# Patient Record
Sex: Male | Born: 1965
Health system: Southern US, Community
[De-identification: ages and names within clinical notes are randomized; demographics above are authoritative.]

## PROBLEM LIST (undated history)

## (undated) DIAGNOSIS — F329 Major depressive disorder, single episode, unspecified: Secondary | ICD-10-CM

## (undated) DIAGNOSIS — R109 Unspecified abdominal pain: Secondary | ICD-10-CM

## (undated) DIAGNOSIS — K2270 Barrett's esophagus without dysplasia: Secondary | ICD-10-CM

## (undated) DIAGNOSIS — R0600 Dyspnea, unspecified: Secondary | ICD-10-CM

## (undated) DIAGNOSIS — T7840XA Allergy, unspecified, initial encounter: Secondary | ICD-10-CM

## (undated) DIAGNOSIS — E1165 Type 2 diabetes mellitus with hyperglycemia: Secondary | ICD-10-CM

## (undated) DIAGNOSIS — U071 COVID-19: Secondary | ICD-10-CM

## (undated) DIAGNOSIS — G4733 Obstructive sleep apnea (adult) (pediatric): Secondary | ICD-10-CM

## (undated) DIAGNOSIS — L409 Psoriasis, unspecified: Secondary | ICD-10-CM

## (undated) DIAGNOSIS — G4700 Insomnia, unspecified: Secondary | ICD-10-CM

## (undated) DIAGNOSIS — R42 Dizziness and giddiness: Secondary | ICD-10-CM

## (undated) DIAGNOSIS — R5383 Other fatigue: Secondary | ICD-10-CM

## (undated) DIAGNOSIS — K5792 Diverticulitis of intestine, part unspecified, without perforation or abscess without bleeding: Secondary | ICD-10-CM

## (undated) DIAGNOSIS — I10 Essential (primary) hypertension: Secondary | ICD-10-CM

## (undated) DIAGNOSIS — M791 Myalgia, unspecified site: Secondary | ICD-10-CM

## (undated) DIAGNOSIS — J45909 Unspecified asthma, uncomplicated: Secondary | ICD-10-CM

## (undated) DIAGNOSIS — Z8489 Family history of other specified conditions: Secondary | ICD-10-CM

## (undated) DIAGNOSIS — N529 Male erectile dysfunction, unspecified: Secondary | ICD-10-CM

## (undated) DIAGNOSIS — G4730 Sleep apnea, unspecified: Secondary | ICD-10-CM

## (undated) DIAGNOSIS — F32A Depression, unspecified: Secondary | ICD-10-CM

## (undated) DIAGNOSIS — F419 Anxiety disorder, unspecified: Secondary | ICD-10-CM

## (undated) DIAGNOSIS — M419 Scoliosis, unspecified: Secondary | ICD-10-CM

## (undated) DIAGNOSIS — E785 Hyperlipidemia, unspecified: Secondary | ICD-10-CM

## (undated) DIAGNOSIS — E119 Type 2 diabetes mellitus without complications: Secondary | ICD-10-CM

## (undated) DIAGNOSIS — G2581 Restless legs syndrome: Secondary | ICD-10-CM

## (undated) DIAGNOSIS — K5900 Constipation, unspecified: Secondary | ICD-10-CM

## (undated) DIAGNOSIS — K219 Gastro-esophageal reflux disease without esophagitis: Secondary | ICD-10-CM

## (undated) DIAGNOSIS — Z9989 Dependence on other enabling machines and devices: Secondary | ICD-10-CM

## (undated) HISTORY — DX: Insomnia, unspecified: G47.00

## (undated) HISTORY — PX: REFRACTIVE SURGERY: SHX103

## (undated) HISTORY — DX: Diverticulitis of intestine, part unspecified, without perforation or abscess without bleeding: K57.92

## (undated) HISTORY — DX: COVID-19: U07.1

## (undated) HISTORY — DX: Scoliosis, unspecified: M41.9

## (undated) HISTORY — PX: NASAL SINUS SURGERY: SHX719

## (undated) HISTORY — DX: Psoriasis, unspecified: L40.9

## (undated) HISTORY — DX: Unspecified abdominal pain: R10.9

## (undated) HISTORY — DX: Other fatigue: R53.83

## (undated) HISTORY — DX: Hyperlipidemia, unspecified: E78.5

## (undated) HISTORY — DX: Sleep apnea, unspecified: G47.30

## (undated) HISTORY — DX: Gastro-esophageal reflux disease without esophagitis: K21.9

## (undated) HISTORY — DX: Myalgia, unspecified site: M79.10

## (undated) HISTORY — DX: Male erectile dysfunction, unspecified: N52.9

## (undated) HISTORY — PX: UPPER GASTROINTESTINAL ENDOSCOPY: SHX188

## (undated) HISTORY — DX: Dependence on other enabling machines and devices: Z99.89

## (undated) HISTORY — DX: Dyspnea, unspecified: R06.00

## (undated) HISTORY — DX: Constipation, unspecified: K59.00

## (undated) HISTORY — PX: UVULECTOMY: SHX2631

## (undated) HISTORY — DX: Anxiety disorder, unspecified: F41.9

## (undated) HISTORY — DX: Essential (primary) hypertension: I10

## (undated) HISTORY — DX: Barrett's esophagus without dysplasia: K22.70

## (undated) HISTORY — DX: Type 2 diabetes mellitus with hyperglycemia: E11.65

## (undated) HISTORY — DX: Dizziness and giddiness: R42

## (undated) HISTORY — DX: Type 2 diabetes mellitus without complications: E11.9

## (undated) HISTORY — DX: Depression, unspecified: F32.A

## (undated) HISTORY — DX: Unspecified asthma, uncomplicated: J45.909

## (undated) HISTORY — DX: Obstructive sleep apnea (adult) (pediatric): G47.33

## (undated) HISTORY — DX: Allergy, unspecified, initial encounter: T78.40XA

## (undated) HISTORY — DX: Restless legs syndrome: G25.81

## (undated) HISTORY — DX: Major depressive disorder, single episode, unspecified: F32.9

---

## 2007-06-16 ENCOUNTER — Ambulatory Visit: Payer: Self-pay | Admitting: Internal Medicine

## 2007-06-16 ENCOUNTER — Ambulatory Visit: Payer: Self-pay | Admitting: *Deleted

## 2007-06-16 ENCOUNTER — Inpatient Hospital Stay (HOSPITAL_COMMUNITY): Admission: EM | Admit: 2007-06-16 | Discharge: 2007-06-17 | Payer: Self-pay | Admitting: Emergency Medicine

## 2007-07-04 ENCOUNTER — Ambulatory Visit: Payer: Self-pay

## 2007-07-05 ENCOUNTER — Ambulatory Visit: Payer: Self-pay | Admitting: Cardiology

## 2007-11-28 ENCOUNTER — Ambulatory Visit: Payer: Self-pay | Admitting: Gastroenterology

## 2007-12-03 DIAGNOSIS — K219 Gastro-esophageal reflux disease without esophagitis: Secondary | ICD-10-CM

## 2007-12-03 HISTORY — DX: Gastro-esophageal reflux disease without esophagitis: K21.9

## 2007-12-08 ENCOUNTER — Ambulatory Visit: Payer: Self-pay | Admitting: Gastroenterology

## 2007-12-08 ENCOUNTER — Encounter: Payer: Self-pay | Admitting: Gastroenterology

## 2011-02-16 NOTE — Assessment & Plan Note (Signed)
[facility]                            CARDIOLOGY OFFICE NOTE   NAME:Mathis, Andrew                        MRN:          [id number]  DATE:07/05/2007                            DOB:          12-15-1965    The patient is a 45 year old gentleman who recently was admitted to  [facility] with atypical chest pain. He did rule out for  myocardial infarction with serial enzymes. His D-dimer was less than  0.22. He was discharged and had an echocardiogram performed on July 04, 2007 and interpreted by Dr. Diona Browner. His LV function was normal.  There was trivial mitral regurgitation as well as tricuspid  regurgitation. A stress echocardiogram was also performed. The patient  exercised for 10 minutes. His systolic blood pressure response was  normal and there was no chest pain or electrocardiographic changes.  There were no stress induced wall motion abnormalities. Since that time  he has not had chest pain, or shortness of breath, and there has been no  pedal edema, or syncope.   MEDICATIONS:  Include:  1. Prilosec 20 mg p.o. daily.  2. Lopressor 12.5 mg p.o. b.i.d.  3. Aspirin 325 mg p.o. daily.  4. Niacin 1 gram p.o. daily.  5. Zocor 20 mg p.o. daily.  6. Enalapril 2.5 mg p.o. daily.   PHYSICAL EXAMINATION:  Shows a blood pressure of 126/86 and his pulse is  73. He weighs 204 pounds.  HEENT: Normal.  NECK: Supple.  CHEST: Clear.  CARDIOVASCULAR EXAM: Reveals a regular rate and rhythm.  ABDOMINAL EXAM: Benign.  EXTREMITIES: Show no edema.   DIAGNOSES:  1. Recent atypical chest pain- his cardiac workup is benign and we      will therefore not pursue any further ischemia evaluation.  2. Hypertension- his blood pressure is well controlled at present. His      enalapril was initiated in the hospital and he apparently had a      BMET drawn at urgent care to follow up on his potassium and renal      function. I have asked him to follow up  with them concerning this      issue. Note if his blood pressure increases in the future then his      enalapril or his Lopressor could certainly be increased but I again      will leave this to his urgent care doctors.  3. Hyperlipidemia- he had niacin and Zocor started in the hospital. He      will need follow up cholesterol and liver function 6 weeks after      initiating this and I have asked him to follow up with the urgent      care doctors concerning this issue as well.   He will continue with risk factor modification and we will see him back  on an as needed basis.     Madolyn Frieze Jens Som, MD, [facility]  Electronically Signed    BSC/MedQ  DD: 07/05/2007  DT: 07/05/2007  Job #: [id number]   cc:   [facility]

## 2011-02-16 NOTE — H&P (Signed)
NAMECHRSTOPHER, Mathis               ACCOUNT NO.:  [id number]   MEDICAL RECORD NO.:  [id number]          PATIENT TYPE:  INP   LOCATION:  4707                         FACILITY:  [facility]   PHYSICIAN:  Wilhemina Bonito, M.D.     DATE OF BIRTH:  1966-07-13   DATE OF ADMISSION:  06/16/2007  DATE OF DISCHARGE:                              HISTORY & PHYSICAL   CHIEF COMPLAINT:  Chest pain.   HISTORY OF PRESENT ILLNESS:  [name] is a 45 year old man with  hypertension, hyperlipidemia, and a strong family history of coronary  artery disease and MI, who presents with a several-hour history of  burning chest pain.  He states that earlier today, he began noticing  right-sided burning and deep chest pain.  He had recent recovered  from a URI in the last two or three days and initially thought that it  may have been related.  At 3:00 p.m. as he was sitting at his desk at  work, the pain got acutely worse and spread diffusely through his entire  chest and back.  He describes the increased pain as a spasm.  The pain  gradually reduced.  He had some nausea associated with the pain, but  denied vomiting, dizziness, numbness, weakness, paresthesias and  shortness of breath.  He has had no cough recently.  He does endorse  occasional bitter taste in his mouth.  Pain is improved by lying still  and nitroglycerin did not affect it.   ALLERGIES:  AMPICILLIN, IODINE, THIMEROSAL.   MEDICATIONS:  1. Prilosec 20 mg daily.  2. Aspirin 81 mg daily.  3. Fish oil.  4. Multivitamin.  5. Garlic supplement.  6. Lysine supplement.  7. St. John's Wort.   PAST MEDICAL HISTORY:  1. Hypertension.  2. Hyperlipidemia.  3. GERD.   FAMILY HISTORY:  He has a family history of multiple coronary artery  disease and MIs in his father and uncles.  He had one uncle who died in  his 54's of myocardial infarction.  He also has a sister with coronary  artery disease.  Father also has a history of diabetes and colon polyps.  There is no family history of stroke or TIA.   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  He is divorced  and has two daughters ages 87 and 18.  He is a former smoker with  multiple quit attempts; he quit the most recent time two-and-a-half  years ago; previously he had been smoking approximately one pack a day.  He also started exercising about a month ago and he states that he can  run five miles without shortness of breath.   REVIEW OF SYSTEMS:  GENERAL:  No fever, chills or dizziness.  CARDIOVASCULAR:  As per HPI.  PULMONARY:  No shortness of breath, no  cough.  GI:  Nausea as per HPI, denies constipation, diarrhea, abdominal  pain, hematochezia and melena.  EXTREMITIES:  Denies swelling or pain.   VITALS:  Temperature 90.8, pulse 79, respirations 16, blood pressure  130/83 and O2 sat is 100% on two liters nasocannula  PHYSICAL EXAMINATION:  GENERAL:  He is alert, no apparent distress.  HEENT:  Extraocular motions intact.  Normocephalic, atraumatic.  Oropharynx clear.  NECK:  No palpable lymphadenopathy.  No thyromegaly and no JVD.  CARDIOVASCULAR:  Regular rate and rhythm without murmurs, rubs or  gallops.  PULMONARY:  Clear to auscultation bilaterally with good air movement and  no increased work of breathing.  ABDOMEN:  Soft, nontender, nondistended with positive bowel sounds.  EXTREMITIES:  No pretibial edema, no tenderness and 2+ tibial and  dorsalis pedis pulses.  NEURO:  Cranial nerves II-XII are grossly intact.  Patient was moving  all extremities and had normal finger-to-nose.   LABS:  CBC:  White blood cells 7.7, hemoglobin 14.8, hematocrit 43.9 and  platelets 279.  An i-STAT basic metabolic panel:  Sodium 138, potassium  3.8, chloride 104, bicarb 28.7, BUN 13, creatinine 0.9 and glucose 99.  D-dimer less than 0.22 and cardiac enzymes x1, myoglobin 48.5, CK-MB  less than 1, troponin less than 0.05.   EKG shows tachycardia with right bundle-branch  block that was seen in a  January EKG.   Chest x-ray done at [facility] and read by [facility]  radiologist as within normal limits.   ASSESSMENT/PLAN:  Mr. Boehringer is a 45 year old man with multiple cardiac  risk factors including hypertension, hyperlipidemia and a significant  family history, who presents with new-onset chest pain.   1. Chest pain.  Patient's description seems most consistent with      gastroesophageal reflux disease-type pain, but given his multiple      risk factors, it is important to rule out acute coronary syndrome.      Cardiac enzymes are negative x1.  Pneumonia or bronchitis is also a      possibility, but unlikely given normal chest x-ray, white blood      cell count and temperature. 1) Repeat cardiac enzymes at 4:00 a.m.,      2)  Repeat EKG in the morning, 3) Labs to include TSH, fasting      lipid profile, hemoglobin A1c, 4) Begin metoprolol at 12.5 mg,      enalapril at 2.5 mg, aspirin 325 mg, nitroglycerin for p.r.n. pain      and continue O2 and morphine for p.r.n. pain, 5) [facility]      to consult.  2. Hyperlipidemia.  Check fasting lipid profile, no statin currently.  3. Gastroesophageal reflux disease.  Start Protonix 40 mg IV daily.  4. FEN/GI:  Regular diet.  5. DVT prophylaxis:  Start Lovenox at prophylactic dose, no      anticoagulation since pain is atypical.      Wilhemina Bonito, M.D.  Electronically Signed     Wilhemina Bonito, M.D.  Electronically Signed    ML/MEDQ  D:  06/16/2007  T:  06/17/2007  Job:  [id number]

## 2011-02-16 NOTE — Assessment & Plan Note (Signed)
[facility]                         GASTROENTEROLOGY OFFICE NOTE   NAME:[name], [name]                        MRN:          [id number]  DATE:11/28/2007                            DOB:          January 09, 1966    PHYSICIAN REQUESTING CONSULT:  Shade Flood, M.D.   REASON FOR CONSULTATION:  Sore throat and chronic GERD.   HISTORY OF PRESENT ILLNESS:  [name] is a 45 year old white male who  relates a 10 year history of reflux symptoms which have been well  controlled on daily Prilosec.  For about the past two months, he has  noticed a sore throat with occasional tightness in his throat.  He has  also had left-sided neck pain intermittently.  He relates no difficulty  swallowing food or pills, but occasionally he has noted some difficulty  swallowing saliva.  He was evaluated at [facility]  by Drs. Janace Hoard and Meredith Staggers.  Blood work from October 19, 2007 including a CBC, comprehensive metabolic panel, lipid panel, TSH  and PSA were unremarkable except for a mildly elevated potassium at 5.3.  A cervical spine series showed slight loss of normal lordosis.  It was  otherwise negative.  He relates that while eating chocolate, he  developed spasms in his throat and neck and he has noted mild,  intermittent vocal fatigue and occasional hoarseness.  He has no solid  food dysphagia and he denies any substernal burning, regurgitation,  nocturnal symptoms, weight loss, melena, hematochezia or change in bowel  habits.   FAMILY HISTORY:  Remarkable for an uncle with colon cancer and a father  with colon polyp.  No other family members with colon cancer, colon  polyps or inflammatory bowel disease.   PAST MEDICAL HISTORY:  1. Hypertension.  2. Hyperlipidemia.  3. GERD.  4. Depression.  5. Anxiety.  6. History of sinusitis.   PAST SURGICAL HISTORY:  Status post uvulectomy and sinus surgery.   CURRENT MEDICATIONS:  Listed on  the chart, undated and reviewed.   MEDICATION ALLERGIES:  AMPICILLIN, IODINE   SOCIAL HISTORY/REVIEW OF SYSTEMS:  Per the handwritten form.   PHYSICAL EXAMINATION:  Well developed, mildly anxious, overweight, white  male.  Height 5 feet 9 inches.  Weight 205.6 pounds.  Blood pressure  120/76, pulse 88 and regular.  HEENT:  Anicteric sclerae.  Oropharynx clear status post uvulectomy.  NECK:  Without thyromegaly, adenopathy or tenderness appreciated.  CHEST:  Clear to auscultation bilaterally.  CARDIAC:  Regular rate and rhythm without murmurs appreciated.  ABDOMEN:  Soft, nontender, nondistended.  Normoactive bowel sounds.  No  palpable organomegaly, masses or hernias.  EXTREMITIES:  Without clubbing, cyanosis or edema.  NEUROLOGIC:  Alert and oriented x3.  Grossly nonfocal.   ASSESSMENT/PLAN:  Chronic gastroesophageal reflux disease with a  recurrent sore throat, throat tightness, left neck pain and intermittent  hoarseness.  He likely has underlying gastroesophageal reflux disease,  but it is difficult to attribute all of his symptoms to gastroesophageal  reflux disease.  We will proceed with upper endoscopy with possible  biopsy for further  evaluation.  Risks, benefits and alternatives to  upper endoscopy with possible biopsy discussed with the patient and he  consents to proceed.  This is scheduled electively.  He is to maintain  all standard anti-reflux measures and Prilosec 20 mg p.o. q.a.m.  I will  plan to  increase his PPI following the EGD.  I have advised him to  contact his ENT physician for additional evaluation.     Venita Lick. Russella Dar, MD, [facility]  Electronically Signed    MTS/MedQ  DD: 11/29/2007  DT: 11/29/2007  Job #: [id number]   cc:   Shade Flood, M.D.

## 2011-02-16 NOTE — Consult Note (Signed)
[name], KATICH NO.:  [id number]   MEDICAL RECORD NO.:  [id number]          PATIENT TYPE:  INP   LOCATION:  1830                         FACILITY:  [facility]   PHYSICIAN:  Adela Ports, MD   DATE OF BIRTH:  1966-09-16   DATE OF CONSULTATION:  06/16/2007  DATE OF DISCHARGE:                                 CONSULTATION   CHIEF COMPLAINT:  Chest pain.   HISTORY OF PRESENT ILLNESS:  [name] is a 45 year old gentleman with  no prior cardiac history but multiple cardiac risk factors including  history of tobacco use, family history of early cardiac disease,  hypertension, and hyperlipidemia.  He presented to the emergency  department today from urgent care after experiencing a sudden onset of  initially right and then bilateral chest burning and pain while sitting  at work this afternoon.  He reports that the onset was fairly sudden and  the pain was 8/10 and stopped him from talking.  He reports mild  shortness of breath and a feeling of flushing but denies nausea,  vomiting or radiation.  He went to an urgent care center and EKG and  chest x-ray there were unrevealing.  He was then moved to the emergency  department here.  His pain at this time has resolved almost completely,  though he still had the residual burning feeling that is exacerbated  with deep breath, movement or palpation.   [name] does have a history of gastroesophageal reflux disease but  says that this feels different than his typical GERD symptoms. He also  reports that he has had a recent viral syndrome over the past few weeks.  He has also started exercising over the past little while and runs up to  5 miles every other day without chest pain or significant dyspnea.   PAST MEDICAL HISTORY:  Hypertension, hypercholesterolemia,  gastroesophageal reflux disease.  He is not on home medical therapy for  these conditions at this time.   SOCIAL HISTORY:  The patient lives in [place]  alone.  He has shared  custody of the children.  He has a history of 30-pack years of smoking  but quit 2 years ago.  He drinks very rarely and exercises by running up  to 5 miles every other day.  He denies drug use.  He works as a Production designer, theatre/television/film  at [employer].   FAMILY HISTORY:  Mother is living and was recently diagnosed with  hypertension, hypercholesteremia at an old age.  Father has a history of  myocardial infarction in his 19s with CABG subsequently.  He has an  uncle with history of coronary disease and there is also prostate  cancer, diabetes and alcoholism in his family.   ALLERGIES:  1. CONTRAST ALLERGY.  2. AMPICILLIN.  3. THIMEROSAL.   MEDICATIONS CURRENTLY IN THE HOSPITAL:  1. Metoprolol 12.5 mg b.i.d.  2. Aspirin 325 mg daily.  3. Protonix 40 mg IV daily.  4. Enalapril 2.5 mg daily.  5. Lovenox 40 mg subcutaneous daily.   MEDICATIONS AT HOME:  None.   REVIEW OF SYSTEMS:  Twelve-system  review of systems is negative in  detail except as described in HPI.   PHYSICAL EXAMINATION:  VITAL SIGNS:  Temperature 98.5, pulse 79,  respiratory rate 16, blood pressure 130/83, oxygen saturation 100% on  room air and on 2 liters nasal cannula.  GENERAL:  Well nourished, well developed, pleasant white gentleman in no  apparent distress, though he is mildly anxious.  HEENT:  Unremarkable.  NECK:  Supple without lymphadenopathy.  No jugular venous distention.  Carotid upstrokes are 2+ bilaterally without bruits.  LUNGS:  Clear to auscultation bilaterally.  CARDIOVASCULAR:  Regular rate and rhythm with normal S1 and S2, no  gallop, no murmur, no rubs appreciated.  Distal pulses 2+ bilaterally  without bruits.  ABDOMEN:  Soft, nontender, nondistended without rebound or guarding and  normal bowel sounds.  EXTREMITIES:  Without clubbing, cyanosis or edema.  MUSCULOSKELETAL:  There is mild anterior chest wall tenderness with  palpation.  NEUROLOGIC:  Exam is appropriate and otherwise  unremarkable.   Chest x-ray is pending, but by report, chest x-ray at urgent care showed  no acute process.   EKG shows a rate of 81 beats per minute with sinus rhythm.  There are no  acute ST or T-wave changes.  There are 0.5 mm PR depressions in a number  of leads and 0.5 mm PR elevation in AVR and V1, but overall, a  nonspecific pattern.  There is an RSR prime in V1 and V2 that appears to  be a normal variant.   LABORATORY:  CBC and basic metabolic panel were unremarkable.  Cardiac  enzymes are negative x1.  D-dimer is negative.   ASSESSMENT:  [name] is a 45 year old male with a number of cardiac  risk factors who presents with quite atypical chest pain.  Differential  diagnoses includes gastroesophageal reflux disease, pericarditis in the  setting of recent viral infection, musculoskeletal pain, acute coronary  syndrome, PE, and anxiety.  I suspect this is either gastroesophageal  reflux disease or pericarditis.   PLAN:  1. Continue to cycle cardiac enzymes and follow EKGs to rule out acute      coronary syndrome.  There is no indication for additional      anticoagulation or antiplatelet therapy at this point.  However, if      his enzymes do turn positive, I would recommend initiating heparin      at treatment dose and a 2B/3A inhibitor with plan for angiography      after the weekend.  Continue aspirin, beta blocker and p.r.n.      nitrates at this point.  2. Risk factor management would include checking a lipid panel and      reinitiating antilipid therapy and continuing antihypertensive      medications on discharge.  3. Further risk stratification as an outpatient would be reasonable      given the patient's cardiac risk factors as well as his anxiety      about his family history and his desire for further risk      stratification.  Please contact [facility] prior to his      discharge for arrangements to have outpatient followup with likely      stress  testing.  4. Would continue a PPI for history of GERD and possible      GERD/esophageal spasm.  5. If myocardial infarction is ruled out and symptoms persist and no      other cause is identified, it would be reasonable to consider  treatment with high dose NSAIDs for possible pericarditis.      Adela Ports, MD  Electronically Signed     DWM/MEDQ  D:  06/16/2007  T:  06/17/2007  Job:  [id number]

## 2011-02-19 NOTE — Discharge Summary (Signed)
NAMEDIGBY, GROENEVELD               ACCOUNT NO.:  [id number]   MEDICAL RECORD NO.:  [id number]          PATIENT TYPE:  INP   LOCATION:  4707                         FACILITY:  [facility]   PHYSICIAN:  Zenaida Deed. Mayford Knife, M.D.DATE OF BIRTH:  1965/12/15   DATE OF ADMISSION:  06/16/2007  DATE OF DISCHARGE:  06/17/2007                               DISCHARGE SUMMARY   PRIMARY CARE PHYSICIAN:  [facility]   DISCHARGE DIAGNOSES:  1. Atypical chest pain, likely gastrointestinal etiology.  2. Gastroesophageal reflux disease  3. Hypertension.  4. Hyperlipidemia.  5. Former smoker.   DISCHARGE MEDICATIONS:  1. Prilosec 20 mg p.o. daily.  2. Metoprolol 12.5 mg p.o. b.i.d.  3. Aspirin 325 mg p.o. daily.  4  Niacin 1 gram p.o. nightly.  5  Simvastatin 20 mg p.o. daily.  6  Enalapril 2.5 mg p.o. daily.  7  Guaifenesin 200 mg p.o. every 4 hours as needed for congestion.   CONSULTATIONS:  [facility] was consulted and arranged for  outpatient stress echocardiogram and followup with Dr. Jens Som.   PROCEDURES:  Chest x-ray on September 12 at [facility] showed no  acute cardiopulmonary disease.  1. EKG showed sinus tachycardia with old right bundle branch block, no      acute changes.   PERTINENT LABORATORY DATA:  Cardiac enzymes were negative x3.  The D-  dimer was negative at less than 0.22.  Electrolytes within normal  limits.  CBC was within normal limits.   BRIEF HOSPITAL COURSE:  This is a 45 year old white male with multiple  cardiac risk factors including hypertension, hyperlipidemia and  significant family history who presented with new onset atypical chest  pain.   #1.  CHEST PAIN:  The patient's description of pain seemed consistent  with a GI etiology.  However, given his multiple risk factors despite  his negative cardiac enzymes and EKG, it was important to rule out acute  coronary syndrome.  Cardiology was consulted to assist with risk  stratification an  outpatient followup.  The patient's chest x-ray was  negative for pulmonary processes.  The patient was started on aspirin as  well as a statin  and niacin for cholesterol to help with risk  stratification.  TSH was normal.  The patient was also initiated on beta  blocker and ACE inhibitor given hypertension.  [facility] agreed  the patient was not having acute ischemia; however, given the patient's  risk factors, agreed that an outpatient stress echocardiogram and  followup with Dr. Jens Som would be prudent, and this was arranged prior  to the patient's discharge.  [facility] will contact the patient  with these appointments shortly after discharge.   #2.  GASTROESOPHAGEAL REFLUX DISEASE:  The patient has extensive history  and has been on Prilosec for years and reports adequate control his  symptoms as long as he takes Prilosec.  Continue proton pump inhibitor  while  in the hospital.   #3.  HYPERTENSION:  The patient was initiated on Toprol and enalapril.  Will need followup BMET approximately 1 week after discharge for  monitoring  of electrolytes and creatinine.   #4.  HYPERLIPIDEMIA:  The patient was initiated on a statin  and  niacin therapy for both low HDL and high LDL.   DISCHARGE INSTRUCTIONS:  The patient is to increase activity as  tolerated.  He is to follow a low-sodium, heart-healthy diet.  He is to  avoid straining and stop any activity that causes chest pain, shortness  of breath, dizziness, sweating or excessive weakness.   FOLLOWUP APPOINTMENTS:  1. The patient is to follow up with [facility] shortly after      discharge.  He is to see Dr. Jens Som, and the [facility]      office is to contact the patient with an appointment for stress      echocardiogram and followup in the office.  2. The patient is to follow up with [facility] within the next      week for repeat BMET to assess potassium and creatinine after       starting enalapril.   PENDING RESULTS AND ISSUES TO BE FOLLOWED AT TIME OF DISCHARGE:  1. The patient will need LFTs rechecked and 3-6 months after      initiating statin  therapy.  2. The patient will need continued monitoring of electrolytes and      renal function after initiation of enalapril therapy.   The patient was discharged home in stable medical condition.      Drue Dun, M.D.  Electronically Signed      Zenaida Deed. Mayford Knife, M.D.  Electronically Signed    EE/MEDQ  D:  06/18/2007  T:  06/18/2007  Job:  [id number]

## 2011-07-16 LAB — CBC
HCT: 43.2
HCT: 43.9
Hemoglobin: 14.6
Hemoglobin: 14.8
MCHC: 33.8
MCHC: 33.8
MCV: 86.3
MCV: 86.5
Platelets: 270
Platelets: 279
RBC: 5.01
RBC: 5.07
RDW: 13.4
RDW: 13.6
WBC: 6.6
WBC: 7.7

## 2011-07-16 LAB — I-STAT 8, (EC8 V) (CONVERTED LAB)
Acid-Base Excess: 3 — ABNORMAL HIGH
Acid-base deficit: 3 — ABNORMAL HIGH
BUN: 12
BUN: 13
Bicarbonate: 21.3
Bicarbonate: 28.7 — ABNORMAL HIGH
Chloride: 104
Chloride: 109
Glucose, Bld: 91
Glucose, Bld: 99
HCT: 45
HCT: 47
Hemoglobin: 15.3
Hemoglobin: 16
Operator id: 272551
Operator id: 277751
Potassium: 3.6
Potassium: 3.8
Sodium: 137
Sodium: 138
TCO2: 22
TCO2: 30
pCO2, Ven: 34.6 — ABNORMAL LOW
pCO2, Ven: 46.9
pH, Ven: 7.394 — ABNORMAL HIGH
pH, Ven: 7.397 — ABNORMAL HIGH

## 2011-07-16 LAB — COMPREHENSIVE METABOLIC PANEL
ALT: 28
AST: 22
Albumin: 3.7
Alkaline Phosphatase: 97
BUN: 14
CO2: 29
Calcium: 8.9
Chloride: 103
Creatinine, Ser: 0.81
GFR calc Af Amer: >60
GFR calc non Af Amer: >60
Glucose, Bld: 107 — ABNORMAL HIGH
Potassium: 4
Sodium: 138
Total Bilirubin: 0.8
Total Protein: 6.4

## 2011-07-16 LAB — DIFFERENTIAL
Basophils Absolute: 0
Basophils Relative: 0
Eosinophils Absolute: 0
Eosinophils Relative: 1
Lymphocytes Relative: 26
Lymphs Abs: 2
Monocytes Absolute: 0.7
Monocytes Relative: 10
Neutro Abs: 4.9
Neutrophils Relative %: 63

## 2011-07-16 LAB — POCT I-STAT CREATININE
Creatinine, Ser: 0.8
Creatinine, Ser: 0.9
Operator id: 272551
Operator id: 277751

## 2011-07-16 LAB — HEMOGLOBIN A1C
Hgb A1c MFr Bld: 5.7
Mean Plasma Glucose: 126

## 2011-07-16 LAB — CK TOTAL AND CKMB (NOT AT ARMC)
CK, MB: 1.4
Relative Index: 0.8
Total CK: 167

## 2011-07-16 LAB — POCT CARDIAC MARKERS
CKMB, poc: <1 — ABNORMAL LOW
Myoglobin, poc: 48.5
Operator id: 272551
Troponin i, poc: <0.05

## 2011-07-16 LAB — D-DIMER, QUANTITATIVE: D-Dimer, Quant: <0.22

## 2011-07-16 LAB — CARDIAC PANEL(CRET KIN+CKTOT+MB+TROPI)
CK, MB: 1
CK, MB: 1
Relative Index: 0.7
Relative Index: 0.8
Total CK: 119
Total CK: 146
Troponin I: 0.01
Troponin I: 0.02

## 2011-07-16 LAB — PROTIME-INR
INR: 0.9
Prothrombin Time: 12.6

## 2011-07-16 LAB — LIPID PANEL
Cholesterol: 202 — ABNORMAL HIGH
HDL: 30 — ABNORMAL LOW
LDL Cholesterol: 138 — ABNORMAL HIGH
Total CHOL/HDL Ratio: 6.7
Triglycerides: 168 — ABNORMAL HIGH
VLDL: 34

## 2011-07-16 LAB — TROPONIN I: Troponin I: 0.01

## 2011-07-16 LAB — TSH: TSH: 1.511

## 2011-10-14 ENCOUNTER — Ambulatory Visit (INDEPENDENT_AMBULATORY_CARE_PROVIDER_SITE_OTHER): Payer: Commercial Managed Care - PPO

## 2011-10-14 DIAGNOSIS — H6120 Impacted cerumen, unspecified ear: Secondary | ICD-10-CM

## 2011-10-14 DIAGNOSIS — E789 Disorder of lipoprotein metabolism, unspecified: Secondary | ICD-10-CM

## 2011-10-14 DIAGNOSIS — Z79899 Other long term (current) drug therapy: Secondary | ICD-10-CM

## 2011-10-14 DIAGNOSIS — I10 Essential (primary) hypertension: Secondary | ICD-10-CM

## 2011-12-21 ENCOUNTER — Other Ambulatory Visit: Payer: Self-pay | Admitting: Family Medicine

## 2012-01-19 ENCOUNTER — Ambulatory Visit (INDEPENDENT_AMBULATORY_CARE_PROVIDER_SITE_OTHER): Payer: Commercial Managed Care - PPO | Admitting: Family Medicine

## 2012-01-19 VITALS — BP 118/82 | HR 76 | Temp 98.3°F | Resp 18 | Ht 69.0 in | Wt 202.0 lb

## 2012-01-19 DIAGNOSIS — E785 Hyperlipidemia, unspecified: Secondary | ICD-10-CM

## 2012-01-19 DIAGNOSIS — F411 Generalized anxiety disorder: Secondary | ICD-10-CM

## 2012-01-19 DIAGNOSIS — I10 Essential (primary) hypertension: Secondary | ICD-10-CM

## 2012-01-19 DIAGNOSIS — F419 Anxiety disorder, unspecified: Secondary | ICD-10-CM

## 2012-01-19 DIAGNOSIS — F449 Dissociative and conversion disorder, unspecified: Secondary | ICD-10-CM

## 2012-01-19 MED ORDER — LISINOPRIL 10 MG PO TABS: 10.0000 mg | ORAL_TABLET | Freq: Every day | ORAL | Status: DC

## 2012-01-19 MED ORDER — CLONAZEPAM 0.5 MG PO TABS: 0.5000 mg | ORAL_TABLET | Freq: Two times a day (BID) | ORAL | Status: DC | PRN

## 2012-01-19 NOTE — Patient Instructions (Signed)
Used the antianxiety medicine only when necessary. If you are having to use it too frequently we will give you a prescription for a longer acting agent.  Continue counseling, regular exercise, and get sufficient rest.  Return for recheck in about 2 months. There problems.

## 2012-01-19 NOTE — Progress Notes (Signed)
Subjective: 46 year old white male who is here for several things. Several flutter. He was undergoing major marital stress and a separation and divorce. He developed left arm numbness and some stuttering and slurred speech. He ended up having a visit to an internist in [facility] having been in the hospital in [facility], and then seeing a neurologist. He had extensive workup including MRI and EEG. Nothing was found except for feeling back this was I. anxiety related condition (consistent with a conversion reaction) and he is seeing a therapist, and doing well with that. He is attending church or when that [employer]. He has cut back his hours at work and reducing stress. He is out of his blood pressure medicine and needs that refilled. He knows he needs to somehow get a physical examination. Change in insurances, and will have a two-week gap he does not have any insurance no other physical symptoms. Denies suicidal ideology.  Objective: White male alert oriented no acute distress. Motor strength is symmetrical her speech is clear. Throat clear. Neck supple without nodes. Chest clear. Heart regular without murmurs. Eyes PERRLA. TMs normal. Gait normal.  Assessment: Anxiety Hypertension Hyperlipidemia Conversion reaction  Plan: Continue his therapy. Gave him clonazepam 0.0.5 milligrams to take twice a day on a prn basis. If he needs it too often we will give him an SSRI  He is scheduled to see me in a couple months. I'll do physical intake that'll skin lesion of his chest wall at that time. It appears to be a seborrheic keratosis which can be frozen off.

## 2012-06-23 ENCOUNTER — Other Ambulatory Visit: Payer: Self-pay | Admitting: Internal Medicine

## 2012-07-13 ENCOUNTER — Other Ambulatory Visit: Payer: Self-pay | Admitting: Internal Medicine

## 2012-08-11 ENCOUNTER — Ambulatory Visit (INDEPENDENT_AMBULATORY_CARE_PROVIDER_SITE_OTHER): Payer: BC Managed Care – PPO | Admitting: Family Medicine

## 2012-08-11 VITALS — BP 122/80 | HR 81 | Temp 98.1°F | Resp 16 | Ht 69.0 in | Wt 192.0 lb

## 2012-08-11 DIAGNOSIS — K2270 Barrett's esophagus without dysplasia: Secondary | ICD-10-CM

## 2012-08-11 DIAGNOSIS — N529 Male erectile dysfunction, unspecified: Secondary | ICD-10-CM

## 2012-08-11 DIAGNOSIS — E785 Hyperlipidemia, unspecified: Secondary | ICD-10-CM

## 2012-08-11 DIAGNOSIS — F411 Generalized anxiety disorder: Secondary | ICD-10-CM

## 2012-08-11 DIAGNOSIS — I10 Essential (primary) hypertension: Secondary | ICD-10-CM

## 2012-08-11 DIAGNOSIS — Z23 Encounter for immunization: Secondary | ICD-10-CM

## 2012-08-11 DIAGNOSIS — F419 Anxiety disorder, unspecified: Secondary | ICD-10-CM

## 2012-08-11 DIAGNOSIS — Z0000 Encounter for general adult medical examination without abnormal findings: Secondary | ICD-10-CM

## 2012-08-11 LAB — LIPID PANEL
Cholesterol: 161 mg/dL (ref 0–200)
HDL: 39 mg/dL — ABNORMAL LOW (ref >39)
LDL Cholesterol: 97 mg/dL (ref 0–99)
Total CHOL/HDL Ratio: 4.1 Ratio
Triglycerides: 127 mg/dL (ref <150)
VLDL: 25 mg/dL (ref 0–40)

## 2012-08-11 LAB — COMPREHENSIVE METABOLIC PANEL
ALT: 24 U/L (ref 0–53)
AST: 17 U/L (ref 0–37)
Albumin: 4.9 g/dL (ref 3.5–5.2)
Alkaline Phosphatase: 101 U/L (ref 39–117)
BUN: 14 mg/dL (ref 6–23)
CO2: 30 mEq/L (ref 19–32)
Calcium: 10 mg/dL (ref 8.4–10.5)
Chloride: 101 mEq/L (ref 96–112)
Creat: 0.81 mg/dL (ref 0.50–1.35)
Glucose, Bld: 89 mg/dL (ref 70–99)
Potassium: 4.7 mEq/L (ref 3.5–5.3)
Sodium: 137 mEq/L (ref 135–145)
Total Bilirubin: 0.7 mg/dL (ref 0.3–1.2)
Total Protein: 7.5 g/dL (ref 6.0–8.3)

## 2012-08-11 LAB — POCT CBC
Granulocyte percent: 67 %G (ref 37–80)
HCT, POC: 52 % (ref 43.5–53.7)
Hemoglobin: 16.5 g/dL (ref 14.1–18.1)
Lymph, poc: 1.7 (ref 0.6–3.4)
MCH, POC: 29.2 pg (ref 27–31.2)
MCHC: 31.7 g/dL — AB (ref 31.8–35.4)
MCV: 92.1 fL (ref 80–97)
MID (cbc): 0.4 (ref 0–0.9)
MPV: 9.1 fL (ref 0–99.8)
POC Granulocyte: 4.4 (ref 2–6.9)
POC LYMPH PERCENT: 26.2 %L (ref 10–50)
POC MID %: 6.8 %M (ref 0–12)
Platelet Count, POC: 308 10*3/uL (ref 142–424)
RBC: 5.65 M/uL (ref 4.69–6.13)
RDW, POC: 14.3 %
WBC: 6.5 10*3/uL (ref 4.6–10.2)

## 2012-08-11 LAB — TSH: TSH: 0.728 u[IU]/mL (ref 0.350–4.500)

## 2012-08-11 LAB — IFOBT (OCCULT BLOOD): IFOBT: NEGATIVE

## 2012-08-11 LAB — POCT GLYCOSYLATED HEMOGLOBIN (HGB A1C): Hemoglobin A1C: 5.4

## 2012-08-11 MED ORDER — LISINOPRIL 10 MG PO TABS: 10.0000 mg | ORAL_TABLET | Freq: Every day | ORAL | Status: DC

## 2012-08-11 MED ORDER — ALPRAZOLAM 0.25 MG PO TABS: 0.2500 mg | ORAL_TABLET | Freq: Two times a day (BID) | ORAL | Status: DC | PRN

## 2012-08-11 MED ORDER — BUPROPION HCL ER (XL) 300 MG PO TB24: 300.0000 mg | ORAL_TABLET | Freq: Every day | ORAL | Status: DC

## 2012-08-11 MED ORDER — SIMVASTATIN 20 MG PO TABS: 20.0000 mg | ORAL_TABLET | Freq: Every day | ORAL | Status: DC

## 2012-08-11 MED ORDER — OMEPRAZOLE 20 MG PO CPDR: 20.0000 mg | DELAYED_RELEASE_CAPSULE | Freq: Two times a day (BID) | ORAL | Status: DC

## 2012-08-11 MED ORDER — VARDENAFIL HCL 10 MG PO TABS: 10.0000 mg | ORAL_TABLET | Freq: Every day | ORAL | Status: DC | PRN

## 2012-08-11 NOTE — Patient Instructions (Signed)
Return in 6 months

## 2012-08-11 NOTE — Progress Notes (Signed)
History: Patient is here for his annual physical. He has several things on his mind to discuss, but no major complaints.  He has used the clonipen and but has felt like it cause excessive daytime drowsiness. He had bouts of Xanax and that seemed to work much better for him.  He has had some problems with erectile difficulty. This may take along with his stressful and hectic life pace at his workplace.  The major new issues.  Past medical history: See a stream chart  Social history: Married, 2 children who are joint custody. Works for [employer].  Review of systems: Constitutional unremarkable HEENT: Unremarkable Respiratory: Unremarkable Cardiovascular: Unremarkable GI: Unremarkable GU: Unremarkable The skeletal: Unremarkable Dermatologic: Has the lesion on his right upper chest wall which we take off a week or so Neurologic: Unremarkable Hematologic: Unremarkable Psychiatric: Has his near panic episodes as noted above Endocrinologic: Unremarkable Patient would like a referral for sleep study to reassess his BiPAP needs. Also would like a endoscopy for reassessing his history of Barrett's esophagus.  Physical examination pleasant alert male in no acute distress. HEENT: Eyes PERRLA. Fundi benign. TMs normal. Throat clear. Neck supple without nodes or thyromegaly. Chest is clear to auscultation. Heart regular without murmur scalp slight redness. And soft without mass or tenderness. Normal male external genitalia testes descended. No hernias. Digital rectal exam is normal. Skin unremarkable. Has a little slightly pigmented pain I tried chest wall. He has had his uvula removed.  Assessment: Normal physical examination Hyperlipidemia Hypertension, controlled Anxiety and panic disorder History of sleep apnea History of Barrett's esophagus.  ED Sebaceous cyst left scalp  Plan: Arrange for sleep study to followup Referral for followup to endoscopy for Barrett's esophagus Try Xanax Try  Levitra Continue medications

## 2012-08-12 LAB — PSA: PSA: 0.83 ng/mL (ref <=4.00)

## 2012-08-14 ENCOUNTER — Encounter: Payer: Self-pay | Admitting: Gastroenterology

## 2012-08-22 ENCOUNTER — Encounter: Payer: Self-pay | Admitting: Physician Assistant

## 2012-08-22 ENCOUNTER — Ambulatory Visit (INDEPENDENT_AMBULATORY_CARE_PROVIDER_SITE_OTHER): Payer: BC Managed Care – PPO | Admitting: Physician Assistant

## 2012-08-22 VITALS — BP 110/72 | HR 74 | Temp 98.4°F | Resp 16 | Ht 68.0 in | Wt 191.8 lb

## 2012-08-22 DIAGNOSIS — L820 Inflamed seborrheic keratosis: Secondary | ICD-10-CM

## 2012-08-22 DIAGNOSIS — F419 Anxiety disorder, unspecified: Secondary | ICD-10-CM | POA: Insufficient documentation

## 2012-08-22 DIAGNOSIS — G4733 Obstructive sleep apnea (adult) (pediatric): Secondary | ICD-10-CM | POA: Insufficient documentation

## 2012-08-22 DIAGNOSIS — K2270 Barrett's esophagus without dysplasia: Secondary | ICD-10-CM

## 2012-08-22 DIAGNOSIS — I10 Essential (primary) hypertension: Secondary | ICD-10-CM | POA: Insufficient documentation

## 2012-08-22 DIAGNOSIS — Z9989 Dependence on other enabling machines and devices: Secondary | ICD-10-CM | POA: Insufficient documentation

## 2012-08-22 NOTE — Progress Notes (Signed)
  Subjective:    Patient ID: [name], male    DOB: 04/16/1966, 46 y.o.   MRN: [id number]  HPI This 46 y.o. male presents for removal of a lesion on the right upper chest.  It was evaluated by Dr. Alwyn Ren at 2 previous visits, and the patient is ready to have it removed.  It has become larger in size, and now gets irritated and painful rubbing on his clothing.  No bleeding or drainage.  He has had 2 previous lesions removed by freezing with success. Review of Systems As above.  PMH, SurgHx, Soc Hx, FHx, current medications, allergies and problem list reviewed and updated.    Objective:   Physical Exam BP 110/72  Pulse 74  Temp 98.4 F (36.9 C) (Oral)  Resp 16  Ht 5\' 8"  (1.727 m)  Wt 191 lb 12.8 oz (87 kg)  BMI 29.16 kg/m2  SpO2 99% WDWNWM, A&O x 3. Right upper chest wall is noted for a gray-colored raised lesion measuring 3-4 mm.  Warty surface.  Non-tender today.  Non-erythematous base.  Verbal consent obtained.  Cleansed skin with alcohol prep pad.  Cryotherapy applied (freeze-thaw-freeze).  The patient tolerated the procedure well.     Assessment & Plan:   1. Keratosis, inflamed seborrheic    Local wound care.  RTC as needed.

## 2012-08-22 NOTE — Patient Instructions (Addendum)
Keep the wound area clean with soap and water daily.  Apply a dab of antibiotic ointment daily until the lesion heals.  Return for re-evaluation if you develop increased pain, swelling, redness, red streaking, drainage of pus, or run a fever >101

## 2012-09-06 ENCOUNTER — Encounter: Payer: Self-pay | Admitting: Gastroenterology

## 2012-09-06 ENCOUNTER — Ambulatory Visit (INDEPENDENT_AMBULATORY_CARE_PROVIDER_SITE_OTHER): Payer: BC Managed Care – PPO | Admitting: Gastroenterology

## 2012-09-06 VITALS — BP 98/70 | HR 80 | Ht 68.0 in | Wt 195.4 lb

## 2012-09-06 DIAGNOSIS — Z1211 Encounter for screening for malignant neoplasm of colon: Secondary | ICD-10-CM

## 2012-09-06 DIAGNOSIS — K2270 Barrett's esophagus without dysplasia: Secondary | ICD-10-CM

## 2012-09-06 NOTE — Progress Notes (Signed)
History of Present Illness: This is a 46 year old male with a history of Barrett's esophagus diagnosed in 2009. His reflux symptoms have been under excellent control on omeprazole 20 mg twice daily and standard antireflux measures. He has no gastrointestinal complaints. Denies weight loss, abdominal pain, constipation, diarrhea, change in stool caliber, melena, hematochezia, nausea, vomiting, dysphagia, reflux symptoms, chest pain.  Allergies  Allergen Reactions  . Thimerosal Swelling  . Ampicillin Rash    Childhood.  . Iodine Rash    Child hood.   Outpatient Prescriptions Prior to Visit  Medication Sig Dispense Refill  . ALPRAZolam (XANAX) 0.25 MG tablet Take 1 tablet (0.25 mg total) by mouth 2 (two) times daily as needed for sleep.  30 tablet  1  . buPROPion (WELLBUTRIN XL) 300 MG 24 hr tablet Take 1 tablet (300 mg total) by mouth daily. Needs office visit (second notice)  33 tablet  12  . lisinopril (PRINIVIL,ZESTRIL) 10 MG tablet Take 1 tablet (10 mg total) by mouth daily.  30 tablet  12  . niacin 500 MG tablet Take 500 mg by mouth daily with breakfast.      . omeprazole (PRILOSEC) 20 MG capsule Take 1 capsule (20 mg total) by mouth 2 (two) times daily.  60 capsule  12  . simvastatin (ZOCOR) 20 MG tablet Take 1 tablet (20 mg total) by mouth daily.  30 tablet  12  . vardenafil (LEVITRA) 10 MG tablet Take 1 tablet (10 mg total) by mouth daily as needed for erectile dysfunction.  3 tablet  5  . zolpidem (AMBIEN) 10 MG tablet Take 10 mg by mouth at bedtime as needed.      . [DISCONTINUED] clonazePAM (KLONOPIN) 0.5 MG tablet Take 1 tablet (0.5 mg total) by mouth 2 (two) times daily as needed for anxiety.  30 tablet  1   Last reviewed on 09/06/2012  9:47 AM by Meryl Dare, MD,FACG Past Medical History  Diagnosis Date  . Depression   . Arthritis   . Hypertension   . OSA on CPAP   . Anxiety   . GERD (gastroesophageal reflux disease) 12/2007  . Barrett's esophagus   . HLD  (hyperlipidemia)    Past Surgical History  Procedure Date  . Refractive surgery     bilateral  . Uvulectomy   . Nasal sinus surgery    History   Social History  . Marital Status: Married    Spouse Name: Gladis Riffle    Number of Children: 2  . Years of Education: 16   Occupational History  . TECHNICIAN MANAGER    Social History Main Topics  . Smoking status: Former Smoker    Types: Cigarettes    Quit date: 01/19/2007  . Smokeless tobacco: Never Used  . Alcohol Use: 0.0 - 2.5 oz/week    0-5 drink(s) per week     Comment: usually once a month  . Drug Use: No  . Sexually Active: Yes -- Male partner(s)   Other Topics Concern  . None   Social History Narrative   Lives with wife (travelling nurse, gone about half the time) from whom he is separated, but on good terms.  Shares custody of his daughters with his first wife.  His current wife has two sons, one grown, the other lives with his father.   Family History  Problem Relation Age of Onset  . Glaucoma Mother   . Cataracts Mother     complicated surgery  . Prostate cancer Father  metastatic  . Hypertension Father   . Alcohol abuse Father   . Heart disease Father   . Diabetes Father   . Anxiety disorder Sister   . Hypertension Sister   . Cirrhosis Father   . Glaucoma Paternal Uncle   . Colon cancer Paternal Uncle     Physical Exam: General: Well developed , well nourished, no acute distress Head: Normocephalic and atraumatic Eyes:  sclerae anicteric, EOMI Ears: Normal auditory acuity Mouth: No deformity or lesions Lungs: Clear throughout to auscultation Heart: Regular rate and rhythm; no murmurs, rubs or bruits Abdomen: Soft, non tender and non distended. No masses, hepatosplenomegaly or hernias noted. Normal Bowel sounds Rectal: Deferred with recent unremarkable exam by his PCP  Musculoskeletal: Symmetrical with no gross deformities  Pulses:  Normal pulses noted Extremities: No clubbing,  cyanosis, edema or deformities noted Neurological: Alert oriented x 4, grossly nonfocal Psychological:  Alert and cooperative. Normal mood and affect  Assessment and Recommendations:  1. Barrett's esophagus. Reflux symptoms under excellent control on omeprazole 20 mg twice a day and standard antireflux measures. Schedule surveillance endoscopy. The risks, benefits, and alternatives to endoscopy with possible biopsy and possible dilation were discussed with the patient and they consent to proceed.   2. Colorectal cancer screening, routine risk. Colonoscopy at age 48.

## 2012-09-06 NOTE — Patient Instructions (Addendum)
You have been scheduled for an endoscopy with propofol. Please follow written instructions given to you at your visit today. If you use inhalers (even only as needed) or a CPAP machine, please bring them with you on the day of your procedure.   You will be due for a recall colonoscopy in 04/2016. We will send you a reminder in the mail when it gets closer to that time.

## 2012-09-11 ENCOUNTER — Ambulatory Visit (AMBULATORY_SURGERY_CENTER): Payer: BC Managed Care – PPO | Admitting: Gastroenterology

## 2012-09-11 ENCOUNTER — Encounter: Payer: Self-pay | Admitting: Gastroenterology

## 2012-09-11 VITALS — BP 108/73 | HR 79 | Temp 98.1°F | Resp 17 | Ht 68.0 in | Wt 195.0 lb

## 2012-09-11 DIAGNOSIS — K2960 Other gastritis without bleeding: Secondary | ICD-10-CM

## 2012-09-11 DIAGNOSIS — K2270 Barrett's esophagus without dysplasia: Secondary | ICD-10-CM

## 2012-09-11 DIAGNOSIS — K209 Esophagitis, unspecified without bleeding: Secondary | ICD-10-CM

## 2012-09-11 DIAGNOSIS — B3781 Candidal esophagitis: Secondary | ICD-10-CM

## 2012-09-11 MED ORDER — SODIUM CHLORIDE 0.9 % IV SOLN: 500.0000 mL | INTRAVENOUS | Status: DC

## 2012-09-11 MED ORDER — FLUCONAZOLE 100 MG PO TABS: 100.0000 mg | ORAL_TABLET | Freq: Every day | ORAL | Status: DC

## 2012-09-11 NOTE — Progress Notes (Addendum)
Patient did not have preoperative order for IV antibiotic SSI prophylaxis. (G8918)  Patient did not experience any of the following events: a burn prior to discharge; a fall within the facility; wrong site/side/patient/procedure/implant event; or a hospital transfer or hospital admission upon discharge from the facility. (G8907)  

## 2012-09-11 NOTE — Patient Instructions (Addendum)

## 2012-09-11 NOTE — Progress Notes (Signed)
Called to room to assist during endoscopic procedure.  Patient ID and intended procedure confirmed with present staff. Received instructions for my participation in the procedure from the performing physician.  

## 2012-09-11 NOTE — Op Note (Addendum)
[facility] [address] [place], [address]   ENDOSCOPY PROCEDURE REPORT  PATIENT: [name], [name]  MR#: [id number] BIRTHDATE: 1966/08/29 , 46  yrs. old GENDER: Male ENDOSCOPIST: Meryl Dare, MD, [facility] PROCEDURE DATE:  09/11/2012 PROCEDURE:  EGD w/ biopsy ASA CLASS:     Class II INDICATIONS:  follow up of Barrett's esophagus. MEDICATIONS: MAC sedation, administered by CRNA and propofol (Diprivan) 200mg  IV TOPICAL ANESTHETIC: Cetacaine Spray DESCRIPTION OF PROCEDURE: After the risks benefits and alternatives of the procedure were thoroughly explained, informed consent was obtained.  The LB [id number]-H180 [id number] endoscope was introduced through the mouth and advanced to the second portion of the duodenum. Without limitations.  The instrument was slowly withdrawn as the mucosa was fully examined.  ESOPHAGUS: There was evidence of Barrett's esophagus.  Multiple biopsies were performed using cold forceps. Sample sent for histology. White/yellow exudates consistent with candidiasis were found in the middle third of the esophagus. Multiple biopsies were performed. Sample sent for histology. STOMACH: The mucosa and folds of the stomach appeared normal. DUODENUM: The duodenal mucosa showed no abnormalities in the bulb and second portion of the duodenum.  Retroflexed views revealed no abnormalities.  The scope was then withdrawn from the patient and the procedure completed.  COMPLICATIONS: There were no complications.  ENDOSCOPIC IMPRESSION: 1.    Barrett's esophagus; multiple biopsies 2.    White/yellow esophageal exudates consistent with candidiasis: multiple biopsies  RECOMMENDATIONS: 1.  Anti-reflux regimen long term 2.  Await pathology results 3.  REPEAT SURVEILLANCE EGD IN 3 YEARS if no dysplasia 4.  Continue PPI long term 5.  Diflucan 100 mg po qd, #7, no refills   eSigned:  Meryl Dare, MD, Clementeen Graham 09/11/2012 2:51 PM   LO:VFIEP Alwyn Ren, MD

## 2012-09-12 ENCOUNTER — Telehealth: Payer: Self-pay | Admitting: *Deleted

## 2012-09-12 NOTE — Telephone Encounter (Signed)
  Follow up Call-  Call back number 09/11/2012  Post procedure Call Back phone  # [phone]  Permission to leave phone message Yes     Patient questions:  Do you have a fever, pain , or abdominal swelling? no Pain Score  0 *  Have you tolerated food without any problems? yes  Have you been able to return to your normal activities? no  Do you have any questions about your discharge instructions: Diet   no Medications  no Follow up visit  no  Do you have questions or concerns about your Care? no  Actions: * If pain score is 4 or above: No action needed, pain <4.

## 2012-09-17 ENCOUNTER — Encounter: Payer: Self-pay | Admitting: Gastroenterology

## 2012-11-09 DIAGNOSIS — Z0271 Encounter for disability determination: Secondary | ICD-10-CM

## 2012-12-07 ENCOUNTER — Ambulatory Visit (INDEPENDENT_AMBULATORY_CARE_PROVIDER_SITE_OTHER): Payer: 59 | Admitting: Family Medicine

## 2012-12-07 VITALS — BP 126/82 | HR 80 | Temp 98.4°F | Resp 17 | Ht 68.5 in | Wt 198.0 lb

## 2012-12-07 DIAGNOSIS — L909 Atrophic disorder of skin, unspecified: Secondary | ICD-10-CM

## 2012-12-07 DIAGNOSIS — G479 Sleep disorder, unspecified: Secondary | ICD-10-CM

## 2012-12-07 DIAGNOSIS — F419 Anxiety disorder, unspecified: Secondary | ICD-10-CM

## 2012-12-07 DIAGNOSIS — L919 Hypertrophic disorder of the skin, unspecified: Secondary | ICD-10-CM

## 2012-12-07 DIAGNOSIS — Z0271 Encounter for disability determination: Secondary | ICD-10-CM

## 2012-12-07 DIAGNOSIS — F411 Generalized anxiety disorder: Secondary | ICD-10-CM

## 2012-12-07 DIAGNOSIS — N529 Male erectile dysfunction, unspecified: Secondary | ICD-10-CM

## 2012-12-07 DIAGNOSIS — L918 Other hypertrophic disorders of the skin: Secondary | ICD-10-CM

## 2012-12-07 MED ORDER — ZOLPIDEM TARTRATE 5 MG PO TABS: 5.0000 mg | ORAL_TABLET | Freq: Every evening | ORAL | Status: DC | PRN

## 2012-12-07 MED ORDER — ALPRAZOLAM 0.25 MG PO TABS: 0.2500 mg | ORAL_TABLET | Freq: Two times a day (BID) | ORAL | Status: DC | PRN

## 2012-12-07 NOTE — Progress Notes (Signed)
Subjective: 47 year old man who has been under a lot of stress. The workplace seems to be the area where he is most stressed. 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 He has had some problems handling stress for some time. He has seen a therapist in the past, but pharmacy year he had not gone to the therapist. He seemed to be decompensating wall and this in February and saw his therapist again. He has determined that he needs to be seen more regularly for that.  He has taken leave from his work last Saturday, for 4 weeks, and plans to return April 1.   He has had some ED problems, and requests medication for that.  He needs his medications refilled as he is switch pharmacies.  He does get some exercise: Walking daily.  He has a skin lesion which got was frozen off but has grown back fairly rapidly. Right upper chest.  Objective: Anxious male in no major distress. HEENT. TMs normal. Throat clear. Neck supple without nodes thyromegaly. Chest clear. Heart regular without murmurs. And soft without mass or tenderness. He has a benign appearing keratotic papilloma at on his right upper chest wall, recurrent for the place previously frozen.  The lesion was frozen with 3 freeze is. Patient tolerated it well. He is instructed in his care.  Assessment: Anxiety Erectile dysfunction Sleep disturbance Hyperkeratotic papilloma chest wall  Plan: Return in about 3 months. Represcribed his Ambien and Xanax which both used when necessary. Gave him a handwritten prescription for Cialis 5 mg #30 to use with the free coupon.

## 2012-12-07 NOTE — Patient Instructions (Addendum)
Keep the skin lesion covered if it starts getting overall. It should come off over the next week or 10 days.  Return in about 3 months.  Keep seeing your therapist regularly.

## 2013-01-23 ENCOUNTER — Other Ambulatory Visit: Payer: Self-pay | Admitting: Family Medicine

## 2013-01-25 ENCOUNTER — Telehealth: Payer: Self-pay

## 2013-02-10 ENCOUNTER — Other Ambulatory Visit: Payer: Self-pay | Admitting: Family Medicine

## 2013-02-13 ENCOUNTER — Other Ambulatory Visit: Payer: Self-pay | Admitting: Family Medicine

## 2013-02-16 NOTE — Telephone Encounter (Signed)
Refill the xanax twice, however must be rechecked before more.  Advise he does not use nightly, only if he has gone a night or two without a good night's rest.

## 2013-02-16 NOTE — Telephone Encounter (Signed)
Called in 2 RFs to [facility] for pt w/Dr Hopper's message and instr's.

## 2013-02-20 NOTE — Telephone Encounter (Signed)
I think this was already done?

## 2013-07-13 ENCOUNTER — Other Ambulatory Visit: Payer: Self-pay | Admitting: Family Medicine

## 2013-07-20 ENCOUNTER — Other Ambulatory Visit: Payer: Self-pay | Admitting: Family Medicine

## 2013-08-19 ENCOUNTER — Other Ambulatory Visit: Payer: Self-pay | Admitting: Family Medicine

## 2013-08-28 ENCOUNTER — Ambulatory Visit (INDEPENDENT_AMBULATORY_CARE_PROVIDER_SITE_OTHER): Payer: 59 | Admitting: Family Medicine

## 2013-08-28 VITALS — BP 114/70 | HR 72 | Temp 98.8°F | Resp 18 | Ht 69.5 in | Wt 194.6 lb

## 2013-08-28 DIAGNOSIS — F411 Generalized anxiety disorder: Secondary | ICD-10-CM

## 2013-08-28 DIAGNOSIS — K228 Other specified diseases of esophagus: Secondary | ICD-10-CM

## 2013-08-28 DIAGNOSIS — I10 Essential (primary) hypertension: Secondary | ICD-10-CM

## 2013-08-28 DIAGNOSIS — Z23 Encounter for immunization: Secondary | ICD-10-CM

## 2013-08-28 DIAGNOSIS — G479 Sleep disorder, unspecified: Secondary | ICD-10-CM

## 2013-08-28 DIAGNOSIS — E785 Hyperlipidemia, unspecified: Secondary | ICD-10-CM

## 2013-08-28 DIAGNOSIS — K2270 Barrett's esophagus without dysplasia: Secondary | ICD-10-CM

## 2013-08-28 DIAGNOSIS — K2289 Other specified disease of esophagus: Secondary | ICD-10-CM

## 2013-08-28 LAB — COMPREHENSIVE METABOLIC PANEL
ALT: 24 U/L (ref 0–53)
AST: 15 U/L (ref 0–37)
Albumin: 4.8 g/dL (ref 3.5–5.2)
Alkaline Phosphatase: 87 U/L (ref 39–117)
BUN: 14 mg/dL (ref 6–23)
CO2: 28 mEq/L (ref 19–32)
Calcium: 9.6 mg/dL (ref 8.4–10.5)
Chloride: 103 mEq/L (ref 96–112)
Creat: 0.86 mg/dL (ref 0.50–1.35)
Glucose, Bld: 91 mg/dL (ref 70–99)
Potassium: 4.4 mEq/L (ref 3.5–5.3)
Sodium: 139 mEq/L (ref 135–145)
Total Bilirubin: 0.6 mg/dL (ref 0.3–1.2)
Total Protein: 7.3 g/dL (ref 6.0–8.3)

## 2013-08-28 LAB — POCT CBC
Granulocyte percent: 64.5 %G (ref 37–80)
HCT, POC: 49.3 % (ref 43.5–53.7)
Hemoglobin: 15.5 g/dL (ref 14.1–18.1)
Lymph, poc: 2 (ref 0.6–3.4)
MCH, POC: 29.6 pg (ref 27–31.2)
MCHC: 31.4 g/dL — AB (ref 31.8–35.4)
MCV: 94.1 fL (ref 80–97)
MID (cbc): 0.4 (ref 0–0.9)
MPV: 9.2 fL (ref 0–99.8)
POC Granulocyte: 4.4 (ref 2–6.9)
POC LYMPH PERCENT: 29.4 %L (ref 10–50)
POC MID %: 6.1 %M (ref 0–12)
Platelet Count, POC: 300 10*3/uL (ref 142–424)
RBC: 5.24 M/uL (ref 4.69–6.13)
RDW, POC: 14 %
WBC: 6.8 10*3/uL (ref 4.6–10.2)

## 2013-08-28 LAB — LIPID PANEL
Cholesterol: 150 mg/dL (ref 0–200)
HDL: 42 mg/dL (ref >39)
LDL Cholesterol: 79 mg/dL (ref 0–99)
Total CHOL/HDL Ratio: 3.6 Ratio
Triglycerides: 145 mg/dL (ref <150)
VLDL: 29 mg/dL (ref 0–40)

## 2013-08-28 MED ORDER — ALPRAZOLAM 0.25 MG PO TABS: 0.2500 mg | ORAL_TABLET | Freq: Two times a day (BID) | ORAL | Status: DC | PRN

## 2013-08-28 MED ORDER — OMEPRAZOLE 20 MG PO CPDR: 20.0000 mg | DELAYED_RELEASE_CAPSULE | Freq: Two times a day (BID) | ORAL | Status: DC

## 2013-08-28 MED ORDER — ZOLPIDEM TARTRATE 5 MG PO TABS: 5.0000 mg | ORAL_TABLET | Freq: Every evening | ORAL | Status: DC | PRN

## 2013-08-28 MED ORDER — SIMVASTATIN 20 MG PO TABS: 20.0000 mg | ORAL_TABLET | Freq: Every day | ORAL | Status: DC

## 2013-08-28 MED ORDER — LISINOPRIL 10 MG PO TABS: 10.0000 mg | ORAL_TABLET | Freq: Every day | ORAL | Status: DC

## 2013-08-28 MED ORDER — BUPROPION HCL ER (XL) 300 MG PO TB24: 300.0000 mg | ORAL_TABLET | Freq: Every day | ORAL | Status: DC

## 2013-08-28 NOTE — Progress Notes (Signed)
Subjective: Patient was going to come in for a physical exam today, but had some issues at home and can work that out. He's been doing quite well since his last visit. He says he just has some much less stress in life. He made a spiritual commitment and really feels like his heart is change in his anxiety levels have changed. He has not taking hardly any of the Xanax or Ambien anymore, though he wants to have them just in case he needs them. He would like his medicines refilled and to come back in for his physical some other day. He attributes our discussions about being physically, emotionally, relation they, and spiritually healthy as being part of him making some of these changes. He feels well today. His ears are bothering him because wax. He hopes to hear from a new job application very soon.  Objective: TMs are partially occluded by cerumen. After irrigation that looks fine. His throat is clear. Neck supple without nodes thyromegaly. Chest clear. Heart regular without murmurs.  Assessment: Hypertension Hyperlipidemia Anxiety improved Sleep disturbance Cerumen impaction  Plan: Refill his medications. He is to come back sometime in the next month to for physical exam. Return for blood today so we'll need the blood work done then. He hopefully will have a new job id.

## 2013-08-28 NOTE — Progress Notes (Signed)
Procedure note: After verbal consent obtained, colace drops placed in both ears, waited 8 minutes. Bilateral ears flushed with equal parts warm water and peroxide with large amount of cerumen removed bilateral. Patient tolerated well with no complaints of pain during or following the procedure.

## 2013-08-28 NOTE — Patient Instructions (Signed)
Continue current medications. If lightheaded and your blood pressure is staying below 120/70 cut back to one half of a blood pressure pill

## 2013-08-29 ENCOUNTER — Encounter: Payer: Self-pay | Admitting: Radiology

## 2014-02-19 ENCOUNTER — Ambulatory Visit (INDEPENDENT_AMBULATORY_CARE_PROVIDER_SITE_OTHER): Payer: 59 | Admitting: Family Medicine

## 2014-02-19 VITALS — BP 118/88 | HR 76 | Temp 98.0°F | Resp 16 | Ht 67.5 in | Wt 198.6 lb

## 2014-02-19 DIAGNOSIS — I10 Essential (primary) hypertension: Secondary | ICD-10-CM

## 2014-02-19 DIAGNOSIS — J302 Other seasonal allergic rhinitis: Secondary | ICD-10-CM

## 2014-02-19 DIAGNOSIS — K2270 Barrett's esophagus without dysplasia: Secondary | ICD-10-CM

## 2014-02-19 DIAGNOSIS — G4733 Obstructive sleep apnea (adult) (pediatric): Secondary | ICD-10-CM

## 2014-02-19 DIAGNOSIS — F419 Anxiety disorder, unspecified: Secondary | ICD-10-CM

## 2014-02-19 DIAGNOSIS — Z0000 Encounter for general adult medical examination without abnormal findings: Secondary | ICD-10-CM

## 2014-02-19 DIAGNOSIS — E785 Hyperlipidemia, unspecified: Secondary | ICD-10-CM

## 2014-02-19 DIAGNOSIS — G479 Sleep disorder, unspecified: Secondary | ICD-10-CM

## 2014-02-19 DIAGNOSIS — F411 Generalized anxiety disorder: Secondary | ICD-10-CM

## 2014-02-19 DIAGNOSIS — Z9989 Dependence on other enabling machines and devices: Principal | ICD-10-CM

## 2014-02-19 DIAGNOSIS — D3110 Benign neoplasm of unspecified cornea: Secondary | ICD-10-CM

## 2014-02-19 DIAGNOSIS — Z79899 Other long term (current) drug therapy: Secondary | ICD-10-CM

## 2014-02-19 DIAGNOSIS — Z8042 Family history of malignant neoplasm of prostate: Secondary | ICD-10-CM

## 2014-02-19 LAB — COMPREHENSIVE METABOLIC PANEL
ALT: 19 U/L (ref 0–53)
AST: 16 U/L (ref 0–37)
Albumin: 4.7 g/dL (ref 3.5–5.2)
Alkaline Phosphatase: 92 U/L (ref 39–117)
BUN: 13 mg/dL (ref 6–23)
CO2: 24 mEq/L (ref 19–32)
Calcium: 9.5 mg/dL (ref 8.4–10.5)
Chloride: 105 mEq/L (ref 96–112)
Creat: 0.84 mg/dL (ref 0.50–1.35)
Glucose, Bld: 103 mg/dL — ABNORMAL HIGH (ref 70–99)
Potassium: 4.2 mEq/L (ref 3.5–5.3)
Sodium: 140 mEq/L (ref 135–145)
Total Bilirubin: 0.6 mg/dL (ref 0.2–1.2)
Total Protein: 6.8 g/dL (ref 6.0–8.3)

## 2014-02-19 LAB — POCT URINALYSIS DIPSTICK
Bilirubin, UA: NEGATIVE
Blood, UA: NEGATIVE
Glucose, UA: NEGATIVE
Ketones, UA: NEGATIVE
Leukocytes, UA: NEGATIVE
Nitrite, UA: NEGATIVE
Protein, UA: NEGATIVE
Spec Grav, UA: 1.02
Urobilinogen, UA: 0.2
pH, UA: 7

## 2014-02-19 LAB — CBC WITH DIFFERENTIAL/PLATELET
Basophils Absolute: 0 10*3/uL (ref 0.0–0.1)
Basophils Relative: 0 % (ref 0–1)
Eosinophils Absolute: 0.1 10*3/uL (ref 0.0–0.7)
Eosinophils Relative: 1 % (ref 0–5)
HCT: 43.8 % (ref 39.0–52.0)
Hemoglobin: 15.3 g/dL (ref 13.0–17.0)
Lymphocytes Relative: 23 % (ref 12–46)
Lymphs Abs: 1.5 10*3/uL (ref 0.7–4.0)
MCH: 29.8 pg (ref 26.0–34.0)
MCHC: 34.9 g/dL (ref 30.0–36.0)
MCV: 85.4 fL (ref 78.0–100.0)
Monocytes Absolute: 0.6 10*3/uL (ref 0.1–1.0)
Monocytes Relative: 9 % (ref 3–12)
Neutro Abs: 4.4 10*3/uL (ref 1.7–7.7)
Neutrophils Relative %: 67 % (ref 43–77)
Platelets: 274 10*3/uL (ref 150–400)
RBC: 5.13 MIL/uL (ref 4.22–5.81)
RDW: 13.7 % (ref 11.5–15.5)
WBC: 6.6 10*3/uL (ref 4.0–10.5)

## 2014-02-19 LAB — LIPID PANEL
Cholesterol: 148 mg/dL (ref 0–200)
HDL: 46 mg/dL (ref >39)
LDL Cholesterol: 81 mg/dL (ref 0–99)
Total CHOL/HDL Ratio: 3.2 Ratio
Triglycerides: 107 mg/dL (ref <150)
VLDL: 21 mg/dL (ref 0–40)

## 2014-02-19 LAB — TSH: TSH: 1.047 u[IU]/mL (ref 0.350–4.500)

## 2014-02-19 LAB — IFOBT (OCCULT BLOOD): IFOBT: NEGATIVE

## 2014-02-19 MED ORDER — SIMVASTATIN 20 MG PO TABS: 20.0000 mg | ORAL_TABLET | Freq: Every day | ORAL | Status: DC

## 2014-02-19 MED ORDER — ZOLPIDEM TARTRATE 5 MG PO TABS: 5.0000 mg | ORAL_TABLET | Freq: Every evening | ORAL | Status: DC | PRN

## 2014-02-19 MED ORDER — BUPROPION HCL ER (XL) 300 MG PO TB24: 300.0000 mg | ORAL_TABLET | Freq: Every day | ORAL | Status: DC

## 2014-02-19 MED ORDER — OMEPRAZOLE 20 MG PO CPDR: 20.0000 mg | DELAYED_RELEASE_CAPSULE | Freq: Two times a day (BID) | ORAL | Status: DC

## 2014-02-19 MED ORDER — ALPRAZOLAM 1 MG PO TABS: 0.5000 mg | ORAL_TABLET | Freq: Two times a day (BID) | ORAL | Status: DC | PRN

## 2014-02-19 MED ORDER — LISINOPRIL 10 MG PO TABS: 10.0000 mg | ORAL_TABLET | Freq: Every day | ORAL | Status: DC

## 2014-02-19 NOTE — Progress Notes (Addendum)
Subjective:   This chart was scribed for [name], MD, by Neta Ehlers, ED Scribe. This  patient's care was started at 8:26 AM.   Patient ID: [name], male    DOB: [birth year], 48 y.o.   MRN: [id number]  Chief Complaint  Patient presents with  . Annual Exam    HPI   [name] is a 48 y.o. male who presents to [facility] for a complete physical exam. The pt had blood work performed five months ago which showed lipid panel at goal with normal CMP and CBC. He had negative in office hemosure, normal hemoglobin, normal A1C, low PSA, and normal TSH in November 2013.  The pt is fasting today, having had only black coffee after 8 pm yesterday evening.   He reports he is leaving his current job in two weeks. 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  He is unsure what his insurance status will be with his new job so is here to catch up before he leaves.   The pt reports his father died in 56 at the age of 43 years; his father had prostate cancer but he also smoked and drank heavily for years causing a variety of medial problems. The pt states he has his prostate checked annually because of this.  [name] reports he walks as exercise, and he is attempting to walk more regularly. He hopes to lose ten pounds permanently.   The pt uses a BIPAP at night. He was assessed at a sleep clinic during which he had apneic episodes 43 times in an hour. He also had an uvulectomy and sinus surgery to address his snoring.The pt voices he has experienced improved sleep with the BIPAP and characterizes it as a "life changer."   He takes Xanax and Ambien rarely but his prescription has expired.  He states that though he does not use Xanax often, when he does, he takes two 0.25 mg. He questions if he can have an increased dosage to decrease cost.   He takes Wellbutrin. He reports a "nervous breakdown" a year and a half ago when he was out of work for a month. He will remain on Wellbutrin at  least for the duration of job transitions but thinks he would like to try to stop it after his new business gets on his feet.   The pt reports he has not had negative side effects from niacin and wonders how much he should take.   [name] was prescribed Prilosec for Barretts esophagus by GI, but he reports he does not take the two pills a day as prescribed - usu just takes qd. He has had improvement to his reflux.   The pt reports he smoked for 25 years prior to quitting.    Past Medical History  Diagnosis Date  . Depression   . Arthritis   . Hypertension   . OSA on CPAP   . Anxiety   . GERD (gastroesophageal reflux disease) 12/2007  . Barrett's esophagus   . HLD (hyperlipidemia)    Past Surgical History  Procedure Laterality Date  . Refractive surgery      bilateral  . Uvulectomy    . Nasal sinus surgery    . Upper gastrointestinal endoscopy     Family History  Problem Relation Age of Onset  . Glaucoma Mother   . Cataracts Mother     complicated surgery  . Prostate cancer Father     metastatic  .  Hypertension Father   . Alcohol abuse Father   . Heart disease Father   . Diabetes Father   . Anxiety disorder Sister   . Hypertension Sister   . Cirrhosis Father   . Glaucoma Paternal Uncle   . Colon cancer Paternal Uncle    Current Outpatient Prescriptions on File Prior to Visit  Medication Sig Dispense Refill  . ALPRAZolam (XANAX) 0.25 MG tablet Take 1 tablet (0.25 mg total) by mouth 2 (two) times daily as needed for anxiety.  30 tablet  1  . buPROPion (WELLBUTRIN XL) 300 MG 24 hr tablet Take 1 tablet (300 mg total) by mouth daily. PATIENT NEEDS OFFICE VISIT FOR ADDITIONAL REFILLS  90 tablet  1  . fluconazole (DIFLUCAN) 100 MG tablet Take 1 tablet (100 mg total) by mouth daily.  7 tablet  0  . lisinopril (PRINIVIL,ZESTRIL) 10 MG tablet Take 1 tablet (10 mg total) by mouth daily.  90 tablet  1  . niacin 500 MG tablet Take 500 mg by mouth daily with breakfast.      .  omeprazole (PRILOSEC) 20 MG capsule Take 1 capsule (20 mg total) by mouth 2 (two) times daily before a meal. PATIENT NEEDS OFFICE VISIT FOR ADDITIONAL REFILLS  180 capsule  1  . simvastatin (ZOCOR) 20 MG tablet Take 1 tablet (20 mg total) by mouth daily. PATIENT NEEDS OFFICE VISIT FOR ADDITIONAL REFILLS  90 tablet  1  . vardenafil (LEVITRA) 10 MG tablet Take 1 tablet (10 mg total) by mouth daily as needed for erectile dysfunction.  3 tablet  5  . zolpidem (AMBIEN) 5 MG tablet Take 1 tablet (5 mg total) by mouth at bedtime as needed.  30 tablet  1   No current facility-administered medications on file prior to visit.   Allergies  Allergen Reactions  . Thimerosal Swelling  . Ampicillin Rash    Childhood.  . Iodine Rash    Child hood.    Review of Systems  Allergic/Immunologic: Positive for environmental allergies.  Psychiatric/Behavioral: The patient is nervous/anxious.   All other systems reviewed and are negative.   Vitals: BP 118/88  Pulse 76  Temp(Src) 98 F (36.7 C) (Oral)  Resp 16  Ht 5' 7.5" (1.715 m)  Wt 198 lb 9.6 oz (90.084 kg)  BMI 30.63 kg/m2  SpO2 97%     Objective:   Physical Exam  Nursing note and vitals reviewed. Constitutional: He is oriented to person, place, and time. He appears well-developed and well-nourished. No distress.  HENT:  Head: Normocephalic and atraumatic.  Right Ear: Tympanic membrane and ear canal normal. No swelling or tenderness. Tympanic membrane is not injected, not perforated, not erythematous, not retracted and not bulging. No middle ear effusion.  Left Ear: Tympanic membrane, external ear and ear canal normal. No swelling or tenderness. Tympanic membrane is not injected, not perforated, not erythematous, not retracted and not bulging.  No middle ear effusion.  Nose: Nose normal. No rhinorrhea or sinus tenderness.  Eyes: EOM are normal.  Neck: Normal range of motion. Neck supple. No tracheal deviation present.  Cardiovascular: Normal  rate, regular rhythm, S1 normal, S2 normal, normal heart sounds and intact distal pulses.   No murmur heard. Pulses:      Dorsalis pedis pulses are 2+ on the right side, and 2+ on the left side.  Pulmonary/Chest: Effort normal and breath sounds normal. No respiratory distress. He has no wheezes. He has no rales.  Abdominal: Soft. Bowel sounds are normal. He  exhibits no distension, no pulsatile midline mass and no mass. There is no hepatosplenomegaly. There is no tenderness.  Genitourinary: Prostate normal.  Musculoskeletal: Normal range of motion.  Lymphadenopathy:       Head (right side): No submental, no submandibular and no posterior auricular adenopathy present.       Head (left side): No submental, no submandibular and no posterior auricular adenopathy present.    He has no cervical adenopathy.       Right: No supraclavicular adenopathy present.       Left: No supraclavicular adenopathy present.  Neurological: He is alert and oriented to person, place, and time.  Reflex Scores:      Patellar reflexes are 2+ on the right side and 2+ on the left side. Skin: Skin is warm and dry.  Psychiatric: He has a normal mood and affect. His behavior is normal.        Assessment & Plan:   OSA on BiPAP - pt continues to be compliant, Sleep disturbance - rare but occ due to bipap - Plan: zolpidem (AMBIEN) 5 MG tablet, TSH  HTN (hypertension) - Plan: lisinopril (PRINIVIL,ZESTRIL) 10 MG tablet - cont to monitor outside the office - if in 110s/70s could try to go off lisinopril.  Barrett's esophagus - Plan: omeprazole (PRILOSEC) 20 MG capsule - rec to cont bid  Other and unspecified hyperlipidemia - Plan: simvastatin (ZOCOR) 20 MG tablet, Lipid panel - lipid panel at goal - encouraged diet and exercise, try to increase otc niacin as tolerates well so maybe can go off zocor in future  Seasonal allergies  Routine Mathis medical examination at a health care facility - Plan: CBC with Differential,  POCT urinalysis dipstick, IFOBT POC (occult bld, rslt in office)  Generalized anxiety disorder - Plan: buPROPion (WELLBUTRIN XL) 300 MG 24 hr tablet, ALPRAZolam (XANAX) 1 MG tablet, CBC with Differential - 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 Ok to stop wellbutrin if wanted.  Family history of prostate cancer - Plan: PSA, POCT urinalysis dipstick  Encounter for long-term (current) use of other medications - Plan: Comprehensive metabolic panel  Meds ordered this encounter  Medications  . buPROPion (WELLBUTRIN XL) 300 MG 24 hr tablet    Sig: Take 1 tablet (300 mg total) by mouth daily.    Dispense:  90 tablet    Refill:  3  . zolpidem (AMBIEN) 5 MG tablet    Sig: Take 1 tablet (5 mg total) by mouth at bedtime as needed.    Dispense:  30 tablet    Refill:  0  . ALPRAZolam (XANAX) 1 MG tablet    Sig: Take 0.5 tablets (0.5 mg total) by mouth 2 (two) times daily as needed for anxiety.    Dispense:  30 tablet    Refill:  0  . lisinopril (PRINIVIL,ZESTRIL) 10 MG tablet    Sig: Take 1 tablet (10 mg total) by mouth daily.    Dispense:  90 tablet    Refill:  3  . omeprazole (PRILOSEC) 20 MG capsule    Sig: Take 1 capsule (20 mg total) by mouth 2 (two) times daily before a meal.    Dispense:  180 capsule    Refill:  3  . simvastatin (ZOCOR) 20 MG tablet    Sig: Take 1 tablet (20 mg total) by mouth daily.    Dispense:  90 tablet    Refill:  3    I personally performed the services described  in this documentation, which was scribed in my presence. The recorded information has been reviewed and considered, and addended by me as needed.  Delman Cheadle, MD MPH

## 2014-02-19 NOTE — Patient Instructions (Signed)

## 2014-02-20 ENCOUNTER — Encounter: Payer: Self-pay | Admitting: Family Medicine

## 2014-02-20 LAB — PSA: PSA: 0.99 ng/mL (ref <=4.00)

## 2014-08-28 ENCOUNTER — Telehealth: Payer: Self-pay

## 2014-08-28 NOTE — Telephone Encounter (Signed)
PA needed for omeprazole. Completed on covermymeds. Pending. 

## 2014-09-13 NOTE — Telephone Encounter (Signed)
PA approved through 08/28/15. Notified pharm.

## 2015-03-03 ENCOUNTER — Other Ambulatory Visit: Payer: Self-pay | Admitting: Family Medicine

## 2015-08-09 ENCOUNTER — Ambulatory Visit (INDEPENDENT_AMBULATORY_CARE_PROVIDER_SITE_OTHER): Payer: BC Managed Care – PPO | Admitting: Family Medicine

## 2015-08-09 VITALS — BP 130/80 | HR 78 | Temp 98.3°F | Resp 16 | Ht 69.0 in | Wt 202.0 lb

## 2015-08-09 DIAGNOSIS — K219 Gastro-esophageal reflux disease without esophagitis: Secondary | ICD-10-CM

## 2015-08-09 DIAGNOSIS — I781 Nevus, non-neoplastic: Secondary | ICD-10-CM | POA: Diagnosis not present

## 2015-08-09 DIAGNOSIS — N529 Male erectile dysfunction, unspecified: Secondary | ICD-10-CM | POA: Diagnosis not present

## 2015-08-09 DIAGNOSIS — G4733 Obstructive sleep apnea (adult) (pediatric): Secondary | ICD-10-CM | POA: Diagnosis not present

## 2015-08-09 DIAGNOSIS — E785 Hyperlipidemia, unspecified: Secondary | ICD-10-CM | POA: Insufficient documentation

## 2015-08-09 DIAGNOSIS — G479 Sleep disorder, unspecified: Secondary | ICD-10-CM

## 2015-08-09 DIAGNOSIS — L821 Other seborrheic keratosis: Secondary | ICD-10-CM | POA: Diagnosis not present

## 2015-08-09 DIAGNOSIS — I10 Essential (primary) hypertension: Secondary | ICD-10-CM | POA: Insufficient documentation

## 2015-08-09 DIAGNOSIS — K2270 Barrett's esophagus without dysplasia: Secondary | ICD-10-CM | POA: Diagnosis not present

## 2015-08-09 DIAGNOSIS — F411 Generalized anxiety disorder: Secondary | ICD-10-CM | POA: Diagnosis not present

## 2015-08-09 DIAGNOSIS — Z8042 Family history of malignant neoplasm of prostate: Secondary | ICD-10-CM | POA: Diagnosis not present

## 2015-08-09 DIAGNOSIS — Z23 Encounter for immunization: Secondary | ICD-10-CM

## 2015-08-09 DIAGNOSIS — Z0000 Encounter for general adult medical examination without abnormal findings: Secondary | ICD-10-CM | POA: Diagnosis not present

## 2015-08-09 LAB — POCT CBC
Granulocyte percent: 68.8 %G (ref 37–80)
HCT, POC: 44.6 % (ref 43.5–53.7)
Hemoglobin: 15.5 g/dL (ref 14.1–18.1)
Lymph, poc: 1.7 (ref 0.6–3.4)
MCH, POC: 30.1 pg (ref 27–31.2)
MCHC: 34.7 g/dL (ref 31.8–35.4)
MCV: 86.7 fL (ref 80–97)
MID (cbc): 0.4 (ref 0–0.9)
MPV: 7.5 fL (ref 0–99.8)
POC Granulocyte: 4.5 (ref 2–6.9)
POC LYMPH PERCENT: 25.6 %L (ref 10–50)
POC MID %: 5.6 %M (ref 0–12)
Platelet Count, POC: 241 10*3/uL (ref 142–424)
RBC: 5.15 M/uL (ref 4.69–6.13)
RDW, POC: 13.7 %
WBC: 6.5 10*3/uL (ref 4.6–10.2)

## 2015-08-09 LAB — COMPLETE METABOLIC PANEL WITH GFR
ALT: 21 U/L (ref 9–46)
AST: 18 U/L (ref 10–40)
Albumin: 4.6 g/dL (ref 3.6–5.1)
Alkaline Phosphatase: 96 U/L (ref 40–115)
BUN: 14 mg/dL (ref 7–25)
CO2: 28 mmol/L (ref 20–31)
Calcium: 9.2 mg/dL (ref 8.6–10.3)
Chloride: 104 mmol/L (ref 98–110)
Creat: 0.85 mg/dL (ref 0.60–1.35)
GFR, Est African American: >89 mL/min (ref >=60)
GFR, Est Non African American: >89 mL/min (ref >=60)
Glucose, Bld: 104 mg/dL — ABNORMAL HIGH (ref 65–99)
Potassium: 4.7 mmol/L (ref 3.5–5.3)
Sodium: 139 mmol/L (ref 135–146)
Total Bilirubin: 0.5 mg/dL (ref 0.2–1.2)
Total Protein: 7 g/dL (ref 6.1–8.1)

## 2015-08-09 LAB — IFOBT (OCCULT BLOOD): IFOBT: NEGATIVE

## 2015-08-09 LAB — LIPID PANEL
Cholesterol: 135 mg/dL (ref 125–200)
HDL: 39 mg/dL — ABNORMAL LOW (ref >=40)
LDL Cholesterol: 81 mg/dL (ref <130)
Total CHOL/HDL Ratio: 3.5 Ratio (ref <=5.0)
Triglycerides: 74 mg/dL (ref <150)
VLDL: 15 mg/dL (ref <30)

## 2015-08-09 MED ORDER — LISINOPRIL 10 MG PO TABS: 10.0000 mg | ORAL_TABLET | Freq: Every day | ORAL | Status: DC

## 2015-08-09 MED ORDER — ZOLPIDEM TARTRATE 5 MG PO TABS: 5.0000 mg | ORAL_TABLET | Freq: Every evening | ORAL | Status: DC | PRN

## 2015-08-09 MED ORDER — BUPROPION HCL ER (XL) 300 MG PO TB24: 300.0000 mg | ORAL_TABLET | Freq: Every day | ORAL | Status: DC

## 2015-08-09 MED ORDER — ALPRAZOLAM 1 MG PO TABS: 0.5000 mg | ORAL_TABLET | Freq: Two times a day (BID) | ORAL | Status: DC | PRN

## 2015-08-09 MED ORDER — OMEPRAZOLE 20 MG PO CPDR: 20.0000 mg | DELAYED_RELEASE_CAPSULE | Freq: Two times a day (BID) | ORAL | Status: DC

## 2015-08-09 MED ORDER — SIMVASTATIN 20 MG PO TABS: 20.0000 mg | ORAL_TABLET | Freq: Every day | ORAL | Status: DC

## 2015-08-09 NOTE — Progress Notes (Signed)
Patient ID: [name], male    DOB: 02-Jun-1966  Age: 49 y.o. MRN: [id number]  Chief Complaint  Patient presents with  . Annual Exam  . Nevus  . Flu Vaccine  . Toe Injury    left big toe  . Sleep Apnea    needs refferal    Subjective:   History: Patient is here for his physical exam. He has several other problems. He has a place on his back which itches. He also needs a flu shot. His left great toe had dropped 40 something pounds on to it and the nail is gradually working its way off any warning that examined. It is not bothering him a lot right now. He has a history of sleep apnea needs to be retested so he get a new machine and would like a referral for that. He goes to [facility].  Past medical history: Medical illnesses: Anxiety, GERD, hypertension, hyperlipidemia, sleep apnea Surgeries: Uvulectomy Medications: See list in chart which includes lisinopril, Wellbutrin, Prilosec, simvastatin, Xanax, and Ambien. He rarely uses the Xanax or Ambien. 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 He does not smoke, drinks rarely. Does not use drugs. He is married and his wife is his only partner. His 3 children are doing well.  Review of systems: Constitutional: Unremarkable except for has gained a few pounds since he changed jobs HEENT some mild allergy symptoms Eyes: Does wear glasses Respiratory: Has history of sleep apnea Cardiovascular: Unremarkable Gastrointestinal: Unremarkable Endocrine: Unremarkable Genitourinary: Unremarkable Muscular skeletal: Unremarkable Dermatologic: He has a couple of moles on his left scalp that he wants to look at and a place on his back which itches. Allergy: Mild seasonal allergies Neurologic: Unremarkable Hematologic: Unremarkable Psychiatric: Unremarkable. His anxieties been stable on his medications and he wishes to continue  taking them  Physical exam: Healthy-appearing man in no acute distress. TMs normal. Eyes PERRLA. Fundi benign. Throat clear with teeth good. Neck supple without nodes thyromegaly. No carotid bruits. Chest is clear to auscultation. Heart regular without murmurs gallops or arrhythmias. Abdomen is soft without organomegaly, masses, or tenderness. Normal male external genitalia with no hernias. Digital rectal exam his prostate gland B normal. He does have a history of his father having prostate cancer. Extremities unremarkable.Marland Kitchen His left large toenail is bruised and starting to a false at the base of it but not loose at all. Urologist let it grow a little bit. 6 Dermody is otherwise unremarkable. Skin normal. He has a lot of little seborrheic keratoses on his back. The area he pointed to as where it itches has nothing. He is to try and identify a given spot that itches, and if it does we will biopsy it. Everything looks benign. He has 2 seborrheic keratoses in his left temple area that he irritates when he comes his hair.  Assessment: Annual physical examination Hypertension Hyperlipidemia Anxiety GERD History of sleep apnea Seborrheic keratoses Itching area on back Family history prostate cancer  Plan: See evaluation and orders. Also see the other note regarding what we did with couple of his more significant complaints.  Current allergies, medications, problem list, past/family and social histories reviewed.  Objective:  BP 130/80 mmHg  Pulse 78  Temp(Src) 98.3 F (36.8 C) (Oral)  Resp 16  Ht 5\' 9"  (1.753 m)  Wt 202 lb (91.627 kg)  BMI 29.82 kg/m2  SpO2 98%  Physical exam: Healthy-appearing  man in no acute distress. TMs normal. Eyes PERRLA. Fundi benign. Throat clear with teeth good. Neck supple without nodes thyromegaly. No carotid bruits. Chest is clear to auscultation. Heart regular without murmurs gallops or arrhythmias. Abdomen is soft without organomegaly, masses, or tenderness.  Normal male external genitalia with no hernias. Digital rectal exam his prostate gland B normal. He does have a history of his father having prostate cancer. Extremities unremarkable.Marland Kitchen His left large toenail is bruised and starting to a false at the base of it but not loose at all. Urologist let it grow a little bit. 6 Dermody is otherwise unremarkable. Skin normal. He has a lot of little seborrheic keratoses on his back. The area he pointed to as where it itches has nothing. He is to try and identify a given spot that itches, and if it does we will biopsy it. Everything looks benign. He has 2 seborrheic keratoses in his left temple area that he irritates when he comes his hair.  Assessment & Plan:   Assessment: 1. Annual physical exam   2. Hyperlipemia   3. Anxiety state   4. Family history of prostate cancer   5. Nevus, non-neoplastic   6. Seborrheic keratoses   7. Gastroesophageal reflux disease without esophagitis   8. Essential hypertension   9. Needs flu shot   10. Obstructive sleep apnea   11. Generalized anxiety disorder   12. Barrett's esophagus without dysplasia   13. Erectile dysfunction, unspecified erectile dysfunction type   14. Sleep disturbance       Plan:   Orders Placed This Encounter  Procedures  . Flu Vaccine QUAD 36+ mos IM  . COMPLETE METABOLIC PANEL WITH GFR  . PSA  . Lipid panel  . Ambulatory referral to Sleep Studies    Referral Priority:  Routine    Referral Type:  Consultation    Referral Reason:  Specialty Services Required    Requested Specialty:  Sleep Medicine    Number of Visits Requested:  1  . POCT CBC  . IFOBT POC (occult bld, rslt in office)    Meds ordered this encounter  Medications  . ALPRAZolam (XANAX) 1 MG tablet    Sig: Take 0.5 tablets (0.5 mg total) by mouth 2 (two) times daily as needed for anxiety.    Dispense:  30 tablet    Refill:  0  . buPROPion (WELLBUTRIN XL) 300 MG 24 hr tablet    Sig: Take 1 tablet (300 mg total) by  mouth daily.    Dispense:  90 tablet    Refill:  3  . lisinopril (PRINIVIL,ZESTRIL) 10 MG tablet    Sig: Take 1 tablet (10 mg total) by mouth daily.    Dispense:  90 tablet    Refill:  3  . omeprazole (PRILOSEC) 20 MG capsule    Sig: Take 1 capsule (20 mg total) by mouth 2 (two) times daily before a meal.    Dispense:  180 capsule    Refill:  3  . simvastatin (ZOCOR) 20 MG tablet    Sig: Take 1 tablet (20 mg total) by mouth daily.    Dispense:  90 tablet    Refill:  3  . zolpidem (AMBIEN) 5 MG tablet    Sig: Take 1 tablet (5 mg total) by mouth at bedtime as needed.    Dispense:  30 tablet    Refill:  0         Patient Instructions  Influenza shot today.  Continue current meds  Return annually or as needed.     No Follow-up on file.   Makinze Jani, MD 08/09/2015

## 2015-08-09 NOTE — Progress Notes (Signed)
Patient ID: [name], male    DOB: 06-Jan-1966  Age: 49 y.o. MRN: [id number]  Needs refills and referral for his sleep apnea  Subjective:   He needs a referral to [facility] for his sleep apnea. He has a BiPAP machine but it needs retested avoiding get a new one. He is having problems with the old machine. He needs his blood pressure and lipid medication refill. He would like the places in his temple frozen off because he keeps catching with a cone. Current allergies, medications, problem list, past/family and social histories reviewed.  Objective:  BP 130/80 mmHg  Pulse 78  Temp(Src) 98.3 F (36.8 C) (Oral)  Resp 16  Ht 5\' 9"  (1.753 m)  Wt 202 lb (91.627 kg)  BMI 29.82 kg/m2  SpO2 98%  No acute distress. 2 seborrheic keratoses, slightly irritated, in the left hairline.  Blood pressure is noted. Chest clear. Heart regular without murmur.  Assessment & Plan:   Assessment: Hypertension Hyperlipidemia Sleep apnea syndrome Seborrheic keratoses   Plan:  Procedure note: Seborrheic keratoses x2 in the left temporal hairline which were inflamed treated with cryosurgery free stall refreeze technique. Patient tolerated procedure well. He was instructed in the care   Will refill his medications Orders Placed This Encounter  Procedures  . Flu Vaccine QUAD 36+ mos IM  . COMPLETE METABOLIC PANEL WITH GFR  . PSA  . Lipid panel  . Ambulatory referral to Sleep Studies    Referral Priority:  Routine    Referral Type:  Consultation    Referral Reason:  Specialty Services Required    Requested Specialty:  Sleep Medicine    Number of Visits Requested:  1  . POCT CBC  . IFOBT POC (occult bld, rslt in office)    Meds ordered this encounter  Medications  . ALPRAZolam (XANAX) 1 MG tablet    Sig: Take 0.5 tablets (0.5 mg total) by mouth 2 (two) times daily as needed for anxiety.    Dispense:  30 tablet    Refill:  0  . buPROPion (WELLBUTRIN XL) 300 MG 24 hr tablet    Sig: Take 1  tablet (300 mg total) by mouth daily.    Dispense:  90 tablet    Refill:  3  . lisinopril (PRINIVIL,ZESTRIL) 10 MG tablet    Sig: Take 1 tablet (10 mg total) by mouth daily.    Dispense:  90 tablet    Refill:  3  . omeprazole (PRILOSEC) 20 MG capsule    Sig: Take 1 capsule (20 mg total) by mouth 2 (two) times daily before a meal.    Dispense:  180 capsule    Refill:  3  . simvastatin (ZOCOR) 20 MG tablet    Sig: Take 1 tablet (20 mg total) by mouth daily.    Dispense:  90 tablet    Refill:  3  . zolpidem (AMBIEN) 5 MG tablet    Sig: Take 1 tablet (5 mg total) by mouth at bedtime as needed.    Dispense:  30 tablet    Refill:  0         Patient Instructions  Influenza shot today.    Continue current meds  Return annually or as needed.     No Follow-up on file.   Darlyne Schmiesing, MD 08/09/2015

## 2015-08-09 NOTE — Patient Instructions (Signed)
Influenza shot today.    Continue current meds  Return annually or as needed.

## 2015-08-11 LAB — PSA: PSA: 0.88 ng/mL (ref <=4.00)

## 2015-09-25 ENCOUNTER — Encounter: Payer: Self-pay | Admitting: Gastroenterology

## 2016-02-09 ENCOUNTER — Encounter: Payer: Self-pay | Admitting: Gastroenterology

## 2016-10-14 ENCOUNTER — Ambulatory Visit (INDEPENDENT_AMBULATORY_CARE_PROVIDER_SITE_OTHER): Payer: BLUE CROSS/BLUE SHIELD | Admitting: Family Medicine

## 2016-10-14 ENCOUNTER — Encounter: Payer: Self-pay | Admitting: Family Medicine

## 2016-10-14 VITALS — BP 128/80 | HR 98 | Temp 98.4°F | Resp 18 | Ht 69.0 in | Wt 205.0 lb

## 2016-10-14 DIAGNOSIS — Z9989 Dependence on other enabling machines and devices: Secondary | ICD-10-CM | POA: Diagnosis not present

## 2016-10-14 DIAGNOSIS — K22719 Barrett's esophagus with dysplasia, unspecified: Secondary | ICD-10-CM

## 2016-10-14 DIAGNOSIS — Z113 Encounter for screening for infections with a predominantly sexual mode of transmission: Secondary | ICD-10-CM | POA: Diagnosis not present

## 2016-10-14 DIAGNOSIS — Z1211 Encounter for screening for malignant neoplasm of colon: Secondary | ICD-10-CM

## 2016-10-14 DIAGNOSIS — Z130 Encounter for screening for diseases of the blood and blood-forming organs and certain disorders involving the immune mechanism: Secondary | ICD-10-CM

## 2016-10-14 DIAGNOSIS — F411 Generalized anxiety disorder: Secondary | ICD-10-CM

## 2016-10-14 DIAGNOSIS — Z23 Encounter for immunization: Secondary | ICD-10-CM

## 2016-10-14 DIAGNOSIS — N529 Male erectile dysfunction, unspecified: Secondary | ICD-10-CM

## 2016-10-14 DIAGNOSIS — Z1212 Encounter for screening for malignant neoplasm of rectum: Secondary | ICD-10-CM

## 2016-10-14 DIAGNOSIS — I10 Essential (primary) hypertension: Secondary | ICD-10-CM | POA: Diagnosis not present

## 2016-10-14 DIAGNOSIS — Z1329 Encounter for screening for other suspected endocrine disorder: Secondary | ICD-10-CM

## 2016-10-14 DIAGNOSIS — K2270 Barrett's esophagus without dysplasia: Secondary | ICD-10-CM

## 2016-10-14 DIAGNOSIS — G4733 Obstructive sleep apnea (adult) (pediatric): Secondary | ICD-10-CM | POA: Diagnosis not present

## 2016-10-14 DIAGNOSIS — E785 Hyperlipidemia, unspecified: Secondary | ICD-10-CM

## 2016-10-14 DIAGNOSIS — Z125 Encounter for screening for malignant neoplasm of prostate: Secondary | ICD-10-CM | POA: Diagnosis not present

## 2016-10-14 DIAGNOSIS — R9431 Abnormal electrocardiogram [ECG] [EKG]: Secondary | ICD-10-CM

## 2016-10-14 DIAGNOSIS — Z1389 Encounter for screening for other disorder: Secondary | ICD-10-CM

## 2016-10-14 DIAGNOSIS — Z1383 Encounter for screening for respiratory disorder NEC: Secondary | ICD-10-CM | POA: Diagnosis not present

## 2016-10-14 DIAGNOSIS — Z136 Encounter for screening for cardiovascular disorders: Secondary | ICD-10-CM | POA: Diagnosis not present

## 2016-10-14 DIAGNOSIS — Z0000 Encounter for general adult medical examination without abnormal findings: Secondary | ICD-10-CM

## 2016-10-14 DIAGNOSIS — G479 Sleep disorder, unspecified: Secondary | ICD-10-CM

## 2016-10-14 LAB — POCT URINALYSIS DIP (MANUAL ENTRY)
Bilirubin, UA: NEGATIVE
Blood, UA: NEGATIVE
Glucose, UA: NEGATIVE
Ketones, POC UA: NEGATIVE
Leukocytes, UA: NEGATIVE
Nitrite, UA: NEGATIVE
Protein Ur, POC: NEGATIVE
Spec Grav, UA: 1.01
Urobilinogen, UA: 0.2
pH, UA: 6

## 2016-10-14 MED ORDER — SIMVASTATIN 20 MG PO TABS: 20.0000 mg | ORAL_TABLET | Freq: Every day | ORAL | 3 refills | Status: DC

## 2016-10-14 MED ORDER — OMEPRAZOLE 20 MG PO CPDR: 20.0000 mg | DELAYED_RELEASE_CAPSULE | Freq: Two times a day (BID) | ORAL | 3 refills | Status: DC

## 2016-10-14 MED ORDER — BUPROPION HCL ER (XL) 300 MG PO TB24: 300.0000 mg | ORAL_TABLET | Freq: Every day | ORAL | 3 refills | Status: DC

## 2016-10-14 MED ORDER — NIACIN 500 MG PO TABS: 500.0000 mg | ORAL_TABLET | Freq: Every day | ORAL | 3 refills | Status: DC

## 2016-10-14 MED ORDER — LISINOPRIL 10 MG PO TABS: 10.0000 mg | ORAL_TABLET | Freq: Every day | ORAL | 3 refills | Status: DC

## 2016-10-14 MED ORDER — ALPRAZOLAM 1 MG PO TABS: 0.5000 mg | ORAL_TABLET | Freq: Two times a day (BID) | ORAL | 1 refills | Status: DC | PRN

## 2016-10-14 MED ORDER — ZOLPIDEM TARTRATE 5 MG PO TABS: 5.0000 mg | ORAL_TABLET | Freq: Every evening | ORAL | 0 refills | Status: DC | PRN

## 2016-10-14 NOTE — Progress Notes (Signed)
Subjective:    Patient ID: [name], male    DOB: 07/14/1966, 51 y.o.   MRN: [id number] Chief Complaint  Patient presents with  . Annual Exam    HPI  [name] is a 51 yo male who is here today for his complete physical.  Primary Preventative Screenings: Prostate Cancer:  STI screening: Colorectal Cancer: He needs initial screening as turned 50 this year. Has seen Dr. 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 Flu shot.  Own business 4 yrs ago.  +father's side with early heart disease. Father passed away at 103 with prostate cancer, h/o MI, heavy EtOH and tobacco, but his siblings also had massive MI but mother's is normal.  Chronic Medical Conditions: HTN: On lisinopril 10 OSA: Long-standing diagnosis on CPAP seen at [facility] Barrett's esophagus/GERD: On omeprazole 20 bid HLD: On simvastatin 20 Anxiety: On Wellbutrin XL 300mg  qd. Tried very rare alprazolam 1 mg or zolpidem 5 mg - he was given 30 tabs of each over one year prior with no refills.  He states he uses #30 alprazoam 1 mg in about 6 mos - will sometimes take 1/2 - often on a Monday morning - never uses more than 1/2 tab twice a week.   Wife was diagnosed with MS for 3 mos.  Past Medical History:  Diagnosis Date  . Anxiety   . Arthritis   . Barrett's esophagus   . Depression   . GERD (gastroesophageal reflux disease) 12/2007  . HLD (hyperlipidemia)   . Hypertension   . OSA on CPAP    Past Surgical History:  Procedure Laterality Date  . NASAL SINUS SURGERY    . REFRACTIVE SURGERY     bilateral  . UPPER GASTROINTESTINAL ENDOSCOPY    . UVULECTOMY     No current outpatient prescriptions on file prior to visit.   No current facility-administered medications on file prior to visit.    Allergies    Allergen Reactions  . Thimerosal Swelling  . Ampicillin Rash    Childhood.  . Iodine Rash    Child hood.   Family History  Problem Relation Age of Onset  . Glaucoma Mother   . Cataracts Mother     complicated surgery  . Prostate cancer Father     metastatic  . Hypertension Father   . Alcohol abuse Father   . Heart disease Father   . Diabetes Father   . Cirrhosis Father   . Anxiety disorder Sister   . Hypertension Sister   . Glaucoma Paternal Uncle   . Colon cancer Paternal Uncle    Social History   Social History  . Marital status: Married    Spouse name: [name]  . Number of children: 2  . Years of education: 16   Occupational History  . TECHNICIAN MANAGER Psychologist, prison and probation services   Social History Main Topics  . Smoking status: Former Smoker    Types: Cigarettes    Quit date: 01/19/2007  . Smokeless tobacco: Never Used  . Alcohol use 0.0 - 2.5 oz/week     Comment: usually once a month  . Drug use: No  . Sexual activity: Yes    Partners: Female   Other Topics Concern  . None   Social History Narrative   Lives with wife (travelling Marine scientist, gone about half the  time) from whom he is separated, but on good terms.  Shares custody of his daughters with his first wife.  His current wife has two sons, one grown, the other lives with his father.   Depression screen [facility] 2/9 10/14/2016 08/09/2015  Decreased Interest 0 0  Down, Depressed, Hopeless 0 0  PHQ - 2 Score 0 0     Review of Systems See hpi    Objective:   Physical Exam  Constitutional: He is oriented to person, place, and time. He appears well-developed and well-nourished. No distress.  HENT:  Head: Normocephalic and atraumatic.  Right Ear: Tympanic membrane, external ear and ear canal normal.  Left Ear: Tympanic membrane, external ear and ear canal normal.  Nose: Nose normal.  Mouth/Throat: Uvula is midline, oropharynx is clear and moist and mucous membranes are normal. No oropharyngeal exudate.   Eyes: Conjunctivae are normal. Right eye exhibits no discharge. Left eye exhibits no discharge. No scleral icterus.  Neck: Normal range of motion. Neck supple. No thyromegaly present.  Cardiovascular: Normal rate, regular rhythm, normal heart sounds and intact distal pulses.   Pulmonary/Chest: Effort normal and breath sounds normal. No respiratory distress.  Abdominal: Soft. Bowel sounds are normal. He exhibits no distension and no mass. There is no tenderness. There is no rebound and no guarding.  Genitourinary: Rectum normal and prostate normal. Prostate is not enlarged and not tender.  Musculoskeletal: He exhibits no edema.  Lymphadenopathy:    He has no cervical adenopathy.  Neurological: He is alert and oriented to person, place, and time. He has normal reflexes. No cranial nerve deficit. He exhibits normal muscle tone.  Skin: Skin is warm and dry. No rash noted. He is not diaphoretic. No erythema.  Psychiatric: He has a normal mood and affect. His behavior is normal.   [facility] reading (PRIMARY) by  Dr. Brigitte Pulse. EKG: NSR, flipped P and T wasves in V1 and V2.  Worse R wave progression in lateral precordial leads This is new from 11/26/2010 in lead V2 (unchanged V1) where both P and T were positive.  Much more of a S wave in V6 compared to 2012. Some minor changes in Lead II and AvF with deepened Q and S waves each since 2012.     Assessment & Plan:  Hiv, ua, cbc, cmp, tsh, lipid, psa Baseline EKG  Refer for colonoscopy - sees Dr. Fuller Plan for Barretts? Ok to call in refills of ambien and xanax both x 1 until f/u next yr.  Refer to cards  1. Annual physical exam   2. Routine screening for STI (sexually transmitted infection)   3. Screening for cardiovascular, respiratory, and genitourinary diseases   4. Screening for colorectal cancer   5. Screening for deficiency anemia   6. Screening for prostate cancer   7. Screening for thyroid disorder   8. Essential hypertension   9. OSA on CPAP    10. Barrett's esophagus with dysplasia   11. Hyperlipidemia, unspecified hyperlipidemia type   12. Anxiety state   13. Need for prophylactic vaccination and inoculation against influenza   14. Nonspecific abnormal electrocardiogram (ECG) (EKG)   15. Generalized anxiety disorder   16. Sleep disturbance   17. Erectile dysfunction, unspecified erectile dysfunction type   18. Barrett's esophagus without dysplasia    Refilled meds, no changes.  Orders Placed This Encounter  Procedures  . Flu Vaccine QUAD 36+ mos IM  . TSH  . CBC  . Comprehensive metabolic panel    Order Specific  Question:   Has the patient fasted?    Answer:   Yes  . PSA  . Lipid panel    Order Specific Question:   Has the patient fasted?    Answer:   Yes  . HIV antibody  . High sensitivity CRP  . High sensitivity CRP  . Specimen status report  . POCT urinalysis dipstick  . EKG 12-Lead    Meds ordered this encounter  Medications  . ALPRAZolam (XANAX) 1 MG tablet    Sig: Take 0.5 tablets (0.5 mg total) by mouth 2 (two) times daily as needed for anxiety.    Dispense:  30 tablet    Refill:  1  . buPROPion (WELLBUTRIN XL) 300 MG 24 hr tablet    Sig: Take 1 tablet (300 mg total) by mouth daily.    Dispense:  90 tablet    Refill:  3  . zolpidem (AMBIEN) 5 MG tablet    Sig: Take 1 tablet (5 mg total) by mouth at bedtime as needed.    Dispense:  30 tablet    Refill:  0  . simvastatin (ZOCOR) 20 MG tablet    Sig: Take 1 tablet (20 mg total) by mouth daily.    Dispense:  90 tablet    Refill:  3  . omeprazole (PRILOSEC) 20 MG capsule    Sig: Take 1 capsule (20 mg total) by mouth 2 (two) times daily before a meal.    Dispense:  180 capsule    Refill:  3  . niacin 500 MG tablet    Sig: Take 1 tablet (500 mg total) by mouth daily with breakfast.    Dispense:  90 tablet    Refill:  3  . lisinopril (PRINIVIL,ZESTRIL) 10 MG tablet    Sig: Take 1 tablet (10 mg total) by mouth daily.    Dispense:  90 tablet     Refill:  3     Delman Cheadle, M.D.  [facility] [address] [place], [place] [address] [phone] phone [phone] fax  11/05/16 10:01 PM

## 2016-10-14 NOTE — Patient Instructions (Addendum)
IF you received an x-ray today, you will receive an invoice from [facility]. Please contact [facility] at [phone] with questions or concerns regarding your invoice.   IF you received labwork today, you will receive an invoice from [facility]. Please contact [facility] at [phone] with questions or concerns regarding your invoice.   Our billing staff will not be able to assist you with questions regarding bills from these companies.  You will be contacted with the lab results as soon as they are available. The fastest way to get your results is to activate your My Chart account. Instructions are located on the last page of this paperwork. If you have not heard from [place] regarding the results in 2 weeks, please contact this office.    Health Maintenance, Male A healthy lifestyle and preventative care can promote health and wellness.  Maintain regular health, dental, and eye exams.  Eat a healthy diet. Foods like vegetables, fruits, whole grains, low-fat dairy products, and lean protein foods contain the nutrients you need and are low in calories. Decrease your intake of foods high in solid fats, added sugars, and salt. Get information about a proper diet from your health care provider, if necessary.  Regular physical exercise is one of the most important things you can do for your health. Most adults should get at least 150 minutes of moderate-intensity exercise (any activity that increases your heart rate and causes you to sweat) each week. In addition, most adults need muscle-strengthening exercises on 2 or more days a week.   Maintain a healthy weight. The body mass index (BMI) is a screening tool to identify possible weight problems. It provides an estimate of body fat based on height and weight. Your health care provider can find your BMI and can help you achieve or maintain a healthy weight. For males 20 years and older:  A BMI below 18.5 is considered  underweight.  A BMI of 18.5 to 24.9 is normal.  A BMI of 25 to 29.9 is considered overweight.  A BMI of 30 and above is considered obese.  Maintain normal blood lipids and cholesterol by exercising and minimizing your intake of saturated fat. Eat a balanced diet with plenty of fruits and vegetables. Blood tests for lipids and cholesterol should begin at age 70 and be repeated every 5 years. If your lipid or cholesterol levels are high, you are over age 51, or you are at high risk for heart disease, you may need your cholesterol levels checked more frequently.Ongoing high lipid and cholesterol levels should be treated with medicines if diet and exercise are not working.  If you smoke, find out from your health care provider how to quit. If you do not use tobacco, do not start.  Lung cancer screening is recommended for adults aged 75-80 years who are at high risk for developing lung cancer because of a history of smoking. A yearly low-dose CT scan of the lungs is recommended for people who have at least a 30-pack-year history of smoking and are current smokers or have quit within the past 15 years. A pack year of smoking is smoking an average of 1 pack of cigarettes a day for 1 year (for example, a 30-pack-year history of smoking could mean smoking 1 pack a day for 30 years or 2 packs a day for 15 years). Yearly screening should continue until the smoker has stopped smoking for at least 15 years. Yearly screening should be stopped for people who  develop a health problem that would prevent them from having lung cancer treatment.  If you choose to drink alcohol, do not have more than 2 drinks per day. One drink is considered to be 12 oz (360 mL) of beer, 5 oz (150 mL) of wine, or 1.5 oz (45 mL) of liquor.  Avoid the use of street drugs. Do not share needles with anyone. Ask for help if you need support or instructions about stopping the use of drugs.  High blood pressure causes heart disease and  increases the risk of stroke. High blood pressure is more likely to develop in:  People who have blood pressure in the end of the normal range (100-139/85-89 mm Hg).  People who are overweight or obese.  People who are African American.  If you are 17-31 years of age, have your blood pressure checked every 3-5 years. If you are 28 years of age or older, have your blood pressure checked every year. You should have your blood pressure measured twice-once when you are at a hospital or clinic, and once when you are not at a hospital or clinic. Record the average of the two measurements. To check your blood pressure when you are not at a hospital or clinic, you can use:  An automated blood pressure machine at a pharmacy.  A home blood pressure monitor.  If you are 57-30 years old, ask your health care provider if you should take aspirin to prevent heart disease.  Diabetes screening involves taking a blood sample to check your fasting blood sugar level. This should be done once every 3 years after age 63 if you are at a normal weight and without risk factors for diabetes. Testing should be considered at a younger age or be carried out more frequently if you are overweight and have at least 1 risk factor for diabetes.  Colorectal cancer can be detected and often prevented. Most routine colorectal cancer screening begins at the age of 48 and continues through age 40. However, your health care provider may recommend screening at an earlier age if you have risk factors for colon cancer. On a yearly basis, your health care provider may provide home test kits to check for hidden blood in the stool. A small camera at the end of a tube may be used to directly examine the colon (sigmoidoscopy or colonoscopy) to detect the earliest forms of colorectal cancer. Talk to your health care provider about this at age 53 when routine screening begins. A direct exam of the colon should be repeated every 5-10 years through  age 77, unless early forms of precancerous polyps or small growths are found.  People who are at an increased risk for hepatitis B should be screened for this virus. You are considered at high risk for hepatitis B if:  You were born in a country where hepatitis B occurs often. Talk with your health care provider about which countries are considered high risk.  Your parents were born in a high-risk country and you have not received a shot to protect against hepatitis B (hepatitis B vaccine).  You have HIV or AIDS.  You use needles to inject street drugs.  You live with, or have sex with, someone who has hepatitis B.  You are a man who has sex with other men (MSM).  You get hemodialysis treatment.  You take certain medicines for conditions like cancer, organ transplantation, and autoimmune conditions.  Hepatitis C blood testing is recommended for all people born  from [place] through 1965 and any individual with known risk factors for hepatitis C.  Healthy men should no longer receive prostate-specific antigen (PSA) blood tests as part of routine cancer screening. Talk to your health care provider about prostate cancer screening.  Testicular cancer screening is not recommended for adolescents or adult males who have no symptoms. Screening includes self-exam, a health care provider exam, and other screening tests. Consult with your health care provider about any symptoms you have or any concerns you have about testicular cancer.  Practice safe sex. Use condoms and avoid high-risk sexual practices to reduce the spread of sexually transmitted infections (STIs).  You should be screened for STIs, including gonorrhea and chlamydia if:  You are sexually active and are younger than 24 years.  You are older than 24 years, and your health care provider tells you that you are at risk for this type of infection.  Your sexual activity has changed since you were last screened, and you are at an  increased risk for chlamydia or gonorrhea. Ask your health care provider if you are at risk.  If you are at risk of being infected with HIV, it is recommended that you take a prescription medicine daily to prevent HIV infection. This is called pre-exposure prophylaxis (PrEP). You are considered at risk if:  You are a man who has sex with other men (MSM).  You are a heterosexual man who is sexually active with multiple partners.  You take drugs by injection.  You are sexually active with a partner who has HIV.  Talk with your health care provider about whether you are at high risk of being infected with HIV. If you choose to begin PrEP, you should first be tested for HIV. You should then be tested every 3 months for as long as you are taking PrEP.  Use sunscreen. Apply sunscreen liberally and repeatedly throughout the day. You should seek shade when your shadow is shorter than you. Protect yourself by wearing long sleeves, pants, a wide-brimmed hat, and sunglasses year round whenever you are outdoors.  Tell your health care provider of new moles or changes in moles, especially if there is a change in shape or color. Also, tell your health care provider if a mole is larger than the size of a pencil eraser.  A one-time screening for abdominal aortic aneurysm (AAA) and surgical repair of large AAAs by ultrasound is recommended for men aged 6-75 years who are current or former smokers.  Stay current with your vaccines (immunizations). This information is not intended to replace advice given to you by your health care provider. Make sure you discuss any questions you have with your health care provider. Document Released: 03/18/2008 Document Revised: 10/11/2014 Document Reviewed: 06/24/2015 Elsevier Interactive Patient Education  2017 [employer].

## 2016-10-15 ENCOUNTER — Encounter: Payer: Self-pay | Admitting: *Deleted

## 2016-10-15 LAB — COMPREHENSIVE METABOLIC PANEL
ALT: 33 IU/L (ref 0–44)
AST: 25 IU/L (ref 0–40)
Albumin/Globulin Ratio: 2 (ref 1.2–2.2)
Albumin: 4.7 g/dL (ref 3.5–5.5)
Alkaline Phosphatase: 97 IU/L (ref 39–117)
BUN/Creatinine Ratio: 14 (ref 9–20)
BUN: 11 mg/dL (ref 6–24)
Bilirubin Total: 0.4 mg/dL (ref 0.0–1.2)
CO2: 21 mmol/L (ref 18–29)
Calcium: 9.3 mg/dL (ref 8.7–10.2)
Chloride: 98 mmol/L (ref 96–106)
Creatinine, Ser: 0.79 mg/dL (ref 0.76–1.27)
GFR calc Af Amer: 121 mL/min/{1.73_m2} (ref >59)
GFR calc non Af Amer: 105 mL/min/{1.73_m2} (ref >59)
Globulin, Total: 2.4 g/dL (ref 1.5–4.5)
Glucose: 104 mg/dL — ABNORMAL HIGH (ref 65–99)
Potassium: 4.2 mmol/L (ref 3.5–5.2)
Sodium: 140 mmol/L (ref 134–144)
Total Protein: 7.1 g/dL (ref 6.0–8.5)

## 2016-10-15 LAB — LIPID PANEL
Chol/HDL Ratio: 3.9 ratio units (ref 0.0–5.0)
Cholesterol, Total: 168 mg/dL (ref 100–199)
HDL: 43 mg/dL (ref >39)
LDL Calculated: 103 mg/dL — ABNORMAL HIGH (ref 0–99)
Triglycerides: 111 mg/dL (ref 0–149)
VLDL Cholesterol Cal: 22 mg/dL (ref 5–40)

## 2016-10-15 LAB — CBC
Hematocrit: 46.3 % (ref 37.5–51.0)
Hemoglobin: 15.3 g/dL (ref 13.0–17.7)
MCH: 29.9 pg (ref 26.6–33.0)
MCHC: 33 g/dL (ref 31.5–35.7)
MCV: 90 fL (ref 79–97)
Platelets: 281 10*3/uL (ref 150–379)
RBC: 5.12 x10E6/uL (ref 4.14–5.80)
RDW: 14.4 % (ref 12.3–15.4)
WBC: 7.3 10*3/uL (ref 3.4–10.8)

## 2016-10-15 LAB — HIV ANTIBODY (ROUTINE TESTING W REFLEX): HIV Screen 4th Generation wRfx: NONREACTIVE

## 2016-10-15 LAB — PSA: Prostate Specific Ag, Serum: 1.6 ng/mL (ref 0.0–4.0)

## 2016-10-15 LAB — TSH: TSH: 1.03 u[IU]/mL (ref 0.450–4.500)

## 2016-10-19 LAB — HIGH SENSITIVITY CRP

## 2016-10-20 LAB — SPECIMEN STATUS REPORT

## 2016-10-20 LAB — HIGH SENSITIVITY CRP: CRP, High Sensitivity: 1.24 mg/L (ref 0.00–3.00)

## 2016-11-05 NOTE — Addendum Note (Signed)
Addended by: Delman Cheadle on: 11/05/2016 10:14 PM   Modules accepted: Orders

## 2016-11-08 ENCOUNTER — Encounter: Payer: Self-pay | Admitting: Gastroenterology

## 2016-11-24 ENCOUNTER — Encounter: Payer: Self-pay | Admitting: Cardiology

## 2016-11-24 ENCOUNTER — Ambulatory Visit (INDEPENDENT_AMBULATORY_CARE_PROVIDER_SITE_OTHER): Payer: BLUE CROSS/BLUE SHIELD | Admitting: Cardiology

## 2016-11-24 VITALS — BP 116/76 | HR 76 | Ht 69.0 in | Wt 201.0 lb

## 2016-11-24 DIAGNOSIS — Z8249 Family history of ischemic heart disease and other diseases of the circulatory system: Secondary | ICD-10-CM

## 2016-11-24 DIAGNOSIS — Z9989 Dependence on other enabling machines and devices: Secondary | ICD-10-CM | POA: Diagnosis not present

## 2016-11-24 DIAGNOSIS — K219 Gastro-esophageal reflux disease without esophagitis: Secondary | ICD-10-CM | POA: Diagnosis not present

## 2016-11-24 DIAGNOSIS — I10 Essential (primary) hypertension: Secondary | ICD-10-CM

## 2016-11-24 DIAGNOSIS — R9439 Abnormal result of other cardiovascular function study: Secondary | ICD-10-CM

## 2016-11-24 DIAGNOSIS — R9431 Abnormal electrocardiogram [ECG] [EKG]: Secondary | ICD-10-CM

## 2016-11-24 DIAGNOSIS — E7849 Other hyperlipidemia: Secondary | ICD-10-CM

## 2016-11-24 DIAGNOSIS — E784 Other hyperlipidemia: Secondary | ICD-10-CM

## 2016-11-24 DIAGNOSIS — K2270 Barrett's esophagus without dysplasia: Secondary | ICD-10-CM | POA: Diagnosis not present

## 2016-11-24 DIAGNOSIS — F17200 Nicotine dependence, unspecified, uncomplicated: Secondary | ICD-10-CM | POA: Insufficient documentation

## 2016-11-24 DIAGNOSIS — G4733 Obstructive sleep apnea (adult) (pediatric): Secondary | ICD-10-CM | POA: Diagnosis not present

## 2016-11-24 NOTE — Assessment & Plan Note (Signed)
Hx of Barrett's esophagus

## 2016-11-24 NOTE — Assessment & Plan Note (Signed)
On statin Rx 

## 2016-11-24 NOTE — Patient Instructions (Signed)
Medication Instructions:  Your physician recommends that you continue on your current medications as directed. Please refer to the Current Medication list given to you today.  If you need a refill on your cardiac medications before your next appointment, please call your pharmacy.  Labwork: NONE  Testing/Procedures: Your physician has requested that you have an exercise tolerance test. For further information please visit [email or web address]. Please also follow instruction sheet, as given.  Follow-Up: AS NEEDED-WE WILL CAL WITH RESULT OF TEST   Thank you for choosing [facility] at [facility]!!    Kerby Moors, LPN

## 2016-11-24 NOTE — Assessment & Plan Note (Signed)
Pt had an abnormal EKG at a routine physical

## 2016-11-24 NOTE — Assessment & Plan Note (Signed)
Controlled on Zestril °

## 2016-11-24 NOTE — Assessment & Plan Note (Signed)
Less than 1/2 pack a day

## 2016-11-24 NOTE — Assessment & Plan Note (Signed)
Father and uncles with a history of early CAD

## 2016-11-24 NOTE — Assessment & Plan Note (Signed)
Complaint with C-pap 

## 2016-11-24 NOTE — Progress Notes (Signed)
11/24/2016 [name]   [birth year]  [id number]  Primary Physician HOPPER,DAVID, MD Primary Cardiologist: Dr Stanford Breed  HPI:  51 y/o male referred to [place] for evaluation of an abnormal EKG noted at his annual physical. The pt had ben seen by [facility] in 2009 for chest pain. He had a normal stress echo then. He has multiple risk factors for CAD as listed below. 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 He has not had chest pain or DOE during his usual daily routine.    Current Outpatient Prescriptions  Medication Sig Dispense Refill  . ALPRAZolam (XANAX) 1 MG tablet Take 0.5 tablets (0.5 mg total) by mouth 2 (two) times daily as needed for anxiety. 30 tablet 1  . buPROPion (WELLBUTRIN XL) 300 MG 24 hr tablet Take 1 tablet (300 mg total) by mouth daily. 90 tablet 3  . lisinopril (PRINIVIL,ZESTRIL) 10 MG tablet Take 1 tablet (10 mg total) by mouth daily. 90 tablet 3  . niacin 500 MG tablet Take 1 tablet (500 mg total) by mouth daily with breakfast. 90 tablet 3  . omeprazole (PRILOSEC) 20 MG capsule Take 1 capsule (20 mg total) by mouth 2 (two) times daily before a meal. 180 capsule 3  . simvastatin (ZOCOR) 20 MG tablet Take 1 tablet (20 mg total) by mouth daily. 90 tablet 3  . zolpidem (AMBIEN) 5 MG tablet Take 1 tablet (5 mg total) by mouth at bedtime as needed. 30 tablet 0   No current facility-administered medications for this visit.     Allergies  Allergen Reactions  . Thimerosal Swelling  . Ampicillin Rash    Childhood.  . Iodine Rash    Child hood.    Social History   Social History  . Marital status: Married    Spouse name: Sherrie Mustache  . Number of children: 2  . Years of education: 16   Occupational History  . TECHNICIAN MANAGER Psychologist, prison and probation services   Social History Main Topics  . Smoking status: Former Smoker    Types: Cigarettes    Quit date: 01/19/2007  . Smokeless tobacco: Never Used  . Alcohol  use 0.0 - 2.5 oz/week     Comment: usually once a month  . Drug use: No  . Sexual activity: Yes    Partners: Female   Other Topics Concern  . Not on file   Social History Narrative   Lives with wife (travelling nurse, gone about half the time) from whom he is separated, but on good terms.  Shares custody of his daughters with his first wife.  His current wife has two sons, one grown, the other lives with his father.     Review of Systems: General: negative for chills, fever, night sweats or weight changes.  Cardiovascular: negative for chest pain, dyspnea on exertion, edema, orthopnea, palpitations, paroxysmal nocturnal dyspnea or shortness of breath Dermatological: negative for rash Respiratory: negative for cough or wheezing Urologic: negative for hematuria Abdominal: negative for nausea, vomiting, diarrhea, bright red blood per rectum, melena, or hematemesis Neurologic: negative for visual changes, syncope, or dizziness All other systems reviewed and are otherwise negative except as noted above.    Blood pressure 116/76, pulse 76, height 5\' 9"  (1.753 m), weight 201 lb (91.2 kg).  General appearance: alert, cooperative and no distress Neck: no carotid bruit and no JVD Lungs: clear to auscultation bilaterally Heart: regular rate and rhythm Abdomen: soft, non-tender; bowel  sounds normal; no masses,  no organomegaly Extremities: extremities normal, atraumatic, no cyanosis or edema Pulses: 2+ and symmetric Skin: Skin color, texture, turgor normal. No rashes or lesions Neurologic: Grossly normal  EKG 1/11/8- NSR with anteroseptal TWI- consider anterior ischemia EKG 11/24/16- NSR  ASSESSMENT AND PLAN:   Abnormal EKG Pt had an abnormal EKG at a routine physical  HTN (hypertension) Controlled on Zestril  Hyperlipemia On statin Rx  OSA on CPAP Complaint with C-pap  Gastroesophageal reflux disease without esophagitis Hx of Barrett's esophagus  Smoker Less than 1/2 pack  a day  Family history of coronary artery disease Father and uncles with a history of early CAD   PLAN  Discussed with Dr Stanford Breed in the office today. Mr Ragans has multiple risk factors for CAD. He wants to start a vigorous exercise program. I suggested we get an exercise test first. He can f/u PRN if this is normal.   Kerin Ransom PA-C 11/24/2016 10:30 AM

## 2016-12-03 ENCOUNTER — Telehealth (HOSPITAL_COMMUNITY): Payer: Self-pay

## 2016-12-03 NOTE — Telephone Encounter (Signed)
Encounter complete. 

## 2016-12-07 ENCOUNTER — Telehealth (HOSPITAL_COMMUNITY): Payer: Self-pay

## 2016-12-07 NOTE — Telephone Encounter (Signed)
Encounter complete. 

## 2016-12-08 ENCOUNTER — Ambulatory Visit (HOSPITAL_COMMUNITY)
Admission: RE | Admit: 2016-12-08 | Discharge: 2016-12-08 | Disposition: A | Discharge status: Home / Self Care | Payer: BLUE CROSS/BLUE SHIELD | Source: Ambulatory Visit | Attending: Cardiovascular Disease | Admitting: Cardiovascular Disease

## 2016-12-08 DIAGNOSIS — R9439 Abnormal result of other cardiovascular function study: Secondary | ICD-10-CM | POA: Insufficient documentation

## 2016-12-08 LAB — EXERCISE TOLERANCE TEST
Estimated workload: 13.4 METS
Exercise duration (min): 11 min
Exercise duration (sec): 0 s
MPHR: 170 {beats}/min
Peak HR: 166 {beats}/min
Percent HR: 97 %
RPE: 17
Rest HR: 83 {beats}/min

## 2017-01-14 ENCOUNTER — Ambulatory Visit (AMBULATORY_SURGERY_CENTER): Payer: Self-pay | Admitting: *Deleted

## 2017-01-14 VITALS — Ht 69.0 in | Wt 202.0 lb

## 2017-01-14 DIAGNOSIS — Z1211 Encounter for screening for malignant neoplasm of colon: Secondary | ICD-10-CM

## 2017-01-14 DIAGNOSIS — K2270 Barrett's esophagus without dysplasia: Secondary | ICD-10-CM

## 2017-01-14 MED ORDER — NA SULFATE-K SULFATE-MG SULF 17.5-3.13-1.6 GM/177ML PO SOLN: ORAL | 0 refills | Status: DC

## 2017-01-14 NOTE — Progress Notes (Signed)
Pt denies allergies to eggs or soy products. Denies difficulty with sedation or anesthesia. Denies any diet or weight loss medications. Denies use of supplemental oxygen.  Emmi instructions given for procedure.  

## 2017-01-18 ENCOUNTER — Encounter: Payer: Self-pay | Admitting: Gastroenterology

## 2017-01-28 ENCOUNTER — Encounter: Payer: Self-pay | Admitting: Gastroenterology

## 2017-04-16 ENCOUNTER — Telehealth: Payer: Self-pay | Admitting: Family Medicine

## 2017-04-16 DIAGNOSIS — F411 Generalized anxiety disorder: Secondary | ICD-10-CM

## 2017-04-21 NOTE — Telephone Encounter (Signed)
PLEASE CALL IN BELOW RX FOR ALPRAZOLAM #30 WITH 1 REFILL. I am out of town so will not be able to print/sign rx.

## 2017-04-22 NOTE — Telephone Encounter (Signed)
XANAX Rx called into [facility] as oerdered per Dr Brigitte Pulse

## 2017-10-11 ENCOUNTER — Other Ambulatory Visit: Payer: Self-pay | Admitting: Family Medicine

## 2017-10-11 NOTE — Telephone Encounter (Signed)
Attempted to contact pt regarding prescription refill requests; pt las office visit 10/14/16; left message on voicemail [phone] to schedule appointment and verify pharmacy; will route to [facility] pool for notification of this encounter

## 2017-11-10 ENCOUNTER — Other Ambulatory Visit: Payer: Self-pay | Admitting: Family Medicine

## 2017-11-10 NOTE — Telephone Encounter (Signed)
Wellbutrin XL refill request Last OV 10/14/16.   Needs appt.   Last month (Jan) was given a 30 day supply and informed he needed to make an appt for future refills.   I don't see where he has a future appt scheduled. [id number], [place]

## 2018-01-09 ENCOUNTER — Other Ambulatory Visit: Payer: Self-pay | Admitting: Family Medicine

## 2018-01-09 DIAGNOSIS — K2270 Barrett's esophagus without dysplasia: Secondary | ICD-10-CM

## 2018-05-11 ENCOUNTER — Encounter: Payer: Self-pay | Admitting: Emergency Medicine

## 2018-05-11 ENCOUNTER — Other Ambulatory Visit: Payer: Self-pay

## 2018-05-11 ENCOUNTER — Ambulatory Visit (INDEPENDENT_AMBULATORY_CARE_PROVIDER_SITE_OTHER): Payer: BLUE CROSS/BLUE SHIELD | Admitting: Emergency Medicine

## 2018-05-11 VITALS — BP 106/78 | HR 88 | Temp 98.4°F | Resp 16 | Ht 68.0 in | Wt 201.6 lb

## 2018-05-11 DIAGNOSIS — K2270 Barrett's esophagus without dysplasia: Secondary | ICD-10-CM

## 2018-05-11 DIAGNOSIS — Z0000 Encounter for general adult medical examination without abnormal findings: Secondary | ICD-10-CM | POA: Diagnosis not present

## 2018-05-11 DIAGNOSIS — L989 Disorder of the skin and subcutaneous tissue, unspecified: Secondary | ICD-10-CM | POA: Insufficient documentation

## 2018-05-11 DIAGNOSIS — F411 Generalized anxiety disorder: Secondary | ICD-10-CM

## 2018-05-11 DIAGNOSIS — Z8669 Personal history of other diseases of the nervous system and sense organs: Secondary | ICD-10-CM

## 2018-05-11 DIAGNOSIS — Z8719 Personal history of other diseases of the digestive system: Secondary | ICD-10-CM

## 2018-05-11 DIAGNOSIS — G479 Sleep disorder, unspecified: Secondary | ICD-10-CM

## 2018-05-11 MED ORDER — LISINOPRIL 10 MG PO TABS: 10.0000 mg | ORAL_TABLET | Freq: Every day | ORAL | 3 refills | Status: DC

## 2018-05-11 MED ORDER — SIMVASTATIN 20 MG PO TABS: 20.0000 mg | ORAL_TABLET | Freq: Every day | ORAL | 3 refills | Status: DC

## 2018-05-11 MED ORDER — NIACIN 500 MG PO TABS: 500.0000 mg | ORAL_TABLET | Freq: Every day | ORAL | 3 refills | Status: AC

## 2018-05-11 MED ORDER — ALPRAZOLAM 1 MG PO TABS: ORAL_TABLET | ORAL | 1 refills | Status: DC

## 2018-05-11 MED ORDER — ZOLPIDEM TARTRATE 5 MG PO TABS: 5.0000 mg | ORAL_TABLET | Freq: Every evening | ORAL | 0 refills | Status: DC | PRN

## 2018-05-11 MED ORDER — BUPROPION HCL ER (XL) 300 MG PO TB24: 300.0000 mg | ORAL_TABLET | Freq: Every day | ORAL | 1 refills | Status: DC

## 2018-05-11 MED ORDER — OMEPRAZOLE 20 MG PO CPDR: 20.0000 mg | DELAYED_RELEASE_CAPSULE | Freq: Two times a day (BID) | ORAL | 3 refills | Status: DC

## 2018-05-11 NOTE — Progress Notes (Signed)
BP Readings from Last 3 Encounters:  11/24/16 116/76  10/14/16 128/80  08/09/15 130/80   Wt Readings from Last 3 Encounters:  05/11/18 201 lb 9.6 oz (91.4 kg)  01/14/17 202 lb (91.6 kg)  11/24/16 201 lb (91.2 kg)   [facility] 52 [facility]   Chief Complaint  Patient presents with  . Annual Exam    HISTORY OF PRESENT ILLNESS: This is a 52 y.o. male Here for annual exam; no complaints and no medical concerns. Has a history of Barrett's esophagus, sleep apnea, hypertension. Also mentions that he has a skin lesion on his left forehead.  HPI   Prior to Admission medications   Medication Sig Start Date End Date Taking? Authorizing Provider  ALPRAZolam Duanne Moron) 1 MG tablet TAKE 1/2 A TABLET BY MOUTH TWICE A DAY AS NEEDED FOR ANXIETY 04/21/17  Yes [name], MD  aspirin EC 81 MG tablet Take 81 mg by mouth daily.   Yes [provider]  buPROPion (WELLBUTRIN XL) 300 MG 24 hr tablet Take 1 tablet (300 mg total) by mouth daily. Needs office visit last refill 11/10/17  Yes [name], MD  lisinopril (PRINIVIL,ZESTRIL) 10 MG tablet TAKE 1 TABLET (10 MG TOTAL) BY MOUTH DAILY. 10/11/17  Yes [name], MD  Multiple Vitamin (MULTIVITAMIN) tablet Take 1 tablet by mouth daily.   Yes [provider]  niacin 500 MG tablet Take 1 tablet (500 mg total) by mouth daily with breakfast. 10/14/16  Yes [name], MD  Omega-3 Fatty Acids (FISH OIL) 500 MG CAPS Take by mouth daily.   Yes [provider]  omeprazole (PRILOSEC) 20 MG capsule Take 1 capsule (20 mg total) by mouth 2 (two) times daily before a meal. 10/14/16  Yes [name], MD  simvastatin (ZOCOR) 20 MG tablet TAKE 1 TABLET (20 MG TOTAL) BY MOUTH DAILY. 10/11/17  Yes [name], MD  zolpidem (AMBIEN) 5 MG tablet Take 1 tablet (5 mg total) by mouth at bedtime as needed. 10/14/16  Yes [name], MD  Na Sulfate-K Sulfate-Mg Sulf 17.5-3.13-1.6 GM/180ML SOLN Suprep as directed.  No substitutions. 01/14/17   Ladene Artist, MD      Allergies  Allergen Reactions  . Thimerosal Swelling  . Ampicillin Rash    Childhood.  . Iodine Rash    Child hood.    Patient Active Problem List   Diagnosis Date Noted  . Abnormal EKG 11/24/2016  . Smoker 11/24/2016  . Family history of coronary artery disease 11/24/2016  . Anxiety state 08/09/2015  . Annual physical exam 08/09/2015  . Hyperlipemia 08/09/2015  . Gastroesophageal reflux disease without esophagitis 08/09/2015  . Essential hypertension 08/09/2015  . HTN (hypertension) 08/22/2012  . Anxiety 08/22/2012  . Barrett's esophagus 08/22/2012  . OSA on CPAP     Past Medical History:  Diagnosis Date  . Allergy    seasonal  . Anxiety   . Barrett's esophagus   . Depression   . GERD (gastroesophageal reflux disease) 12/2007  . HLD (hyperlipidemia)   . Hypertension   . OSA on CPAP   . Sleep apnea    Bipap    Past Surgical History:  Procedure Laterality Date  . NASAL SINUS SURGERY    . REFRACTIVE SURGERY     bilateral  . UPPER GASTROINTESTINAL ENDOSCOPY    . UVULECTOMY      Social History   Socioeconomic History  . Marital status: Married    Spouse name: Gilmore Laroche  [place]  . Number of children: 2  . Years of education: 63  . Highest education level: Not on file  Occupational History  . Occupation: Child psychotherapist: [employer]  Social Needs  . Financial resource strain: Not on file  . Food insecurity:    Worry: Not on file    Inability: Not on file  . Transportation needs:    Medical: Not on file    Non-medical: Not on file  Tobacco Use  . Smoking status: Former Smoker    Types: Cigarettes    Last attempt to quit: 01/19/2007    Years since quitting: 11.3  . Smokeless tobacco: Never Used  Substance and Sexual Activity  . Alcohol use: Yes    Alcohol/week: 0.0 - 5.0 standard drinks    Comment: usually once a month  . Drug use: No  . Sexual activity: Yes    Partners: Female  Lifestyle  . Physical activity:     Days per week: Not on file    Minutes per session: Not on file  . Stress: Not on file  Relationships  . Social connections:    Talks on phone: Not on file    Gets together: Not on file    Attends religious service: Not on file    Active member of club or organization: Not on file    Attends meetings of clubs or organizations: Not on file    Relationship status: Not on file  . Intimate partner violence:    Fear of current or ex partner: Not on file    Emotionally abused: Not on file    Physically abused: Not on file    Forced sexual activity: Not on file  Other Topics Concern  . Not on file  Social History Narrative   Lives with wife (travelling nurse, gone about half the time) from whom he is separated, but on good terms.  Shares custody of his daughters with his first wife.  His current wife has two sons, one grown, the other lives with his father.    Family History  Problem Relation Age of Onset  . Glaucoma Mother   . Cataracts Mother        complicated surgery  . Prostate cancer Father        metastatic  . Hypertension Father   . Alcohol abuse Father   . Heart disease Father   . Diabetes Father   . Cirrhosis Father   . Gout Father   . Arthritis Father   . Anxiety disorder Sister   . Hypertension Sister   . Glaucoma Paternal Uncle   . Heart disease Paternal Uncle   . Alcohol abuse Paternal Uncle   . Colon cancer Paternal Uncle   . Heart disease Paternal Uncle   . Multiple sclerosis Maternal Aunt   . Brain cancer Maternal Uncle   . Alcohol abuse Maternal Uncle      Review of Systems  Constitutional: Negative.  Negative for chills and fever.  HENT: Negative.  Negative for congestion, hearing loss, nosebleeds and sore throat.   Eyes: Negative.  Negative for blurred vision and double vision.  Respiratory: Negative.  Negative for cough, hemoptysis and shortness of breath.   Cardiovascular: Negative.  Negative for chest pain and palpitations.  Gastrointestinal:  Negative.  Negative for abdominal pain, blood in stool, diarrhea, heartburn, nausea and vomiting.  Genitourinary: Negative.  Negative for dysuria and hematuria.       No LUTS symptoms.  Musculoskeletal: Negative.  Negative for back pain, myalgias and neck pain.  Skin: Negative for rash.       Skin lesion to left forehead  Neurological: Negative.  Negative for dizziness and headaches.  Endo/Heme/Allergies: Negative.   All other systems reviewed and are negative.   Vitals:   05/11/18 0853  BP: 106/78  Pulse: 88  Resp: 16  Temp: 98.4 F (36.9 C)  SpO2: 95%    Physical Exam  Constitutional: He is oriented to person, place, and time. He appears well-developed and well-nourished.  HENT:  Head: Normocephalic and atraumatic.  Right Ear: External ear normal.  Left Ear: External ear normal.  Nose: Nose normal.  Mouth/Throat: Oropharynx is clear and moist.  Eyes: Pupils are equal, round, and reactive to light. Conjunctivae and EOM are normal.  Neck: Normal range of motion. Neck supple. No JVD present. No thyromegaly present.  Cardiovascular: Normal rate, regular rhythm, normal heart sounds and intact distal pulses.  Pulmonary/Chest: Effort normal and breath sounds normal. No respiratory distress.  Abdominal: Soft. Bowel sounds are normal. He exhibits no mass. There is no tenderness. There is no rebound and no guarding.  Musculoskeletal: Normal range of motion. He exhibits no edema or tenderness.  Lymphadenopathy:    He has no cervical adenopathy.  Neurological: He is alert and oriented to person, place, and time. No sensory deficit. He exhibits normal muscle tone.  Skin: Skin is warm and dry. Capillary refill takes less than 2 seconds.  Dark slightly raised lesion on left forehead  Psychiatric: He has a normal mood and affect. His behavior is normal.  Vitals reviewed.    ASSESSMENT & PLAN: [name] was seen today for annual exam.  Diagnoses and all orders for this visit:  Routine  general medical examination at a health care facility -     CBC with Differential -     Comprehensive metabolic panel -     Hemoglobin A1c -     Lipid panel -     PSA(Must document that pt has been informed of limitations of PSA testing.) -     Ambulatory referral to Gastroenterology  Skin lesion of face -     Ambulatory referral to Dermatology  History of sleep apnea -     Ambulatory referral to Pulmonology  History of Barrett's esophagus    Patient Instructions       IF you received an x-ray today, you will receive an invoice from [facility]. Please contact [facility] at [phone] with questions or concerns regarding your invoice.   IF you received labwork today, you will receive an invoice from [facility]. Please contact [facility] at [phone] with questions or concerns regarding your invoice.   Our billing staff will not be able to assist you with questions regarding bills from these companies.  You will be contacted with the lab results as soon as they are available. The fastest way to get your results is to activate your My Chart account. Instructions are located on the last page of this paperwork. If you have not heard from [place] regarding the results in 2 weeks, please contact this office.      Health Maintenance, Male A healthy lifestyle and preventive care is important for your health and wellness. Ask your health care provider about what schedule of regular examinations is right for you. What should I know about weight and diet? Eat a Healthy Diet  Eat plenty of vegetables, fruits, whole grains, low-fat dairy products, and lean protein.  Do not eat a lot of foods high in solid fats, added sugars, or salt.  Maintain a Healthy Weight Regular exercise can help you achieve or maintain a healthy weight. You should:  Do at least 150 minutes of exercise each week. The exercise should increase your heart rate and make you sweat (moderate-intensity  exercise).  Do strength-training exercises at least twice a week.  Watch Your Levels of Cholesterol and Blood Lipids  Have your blood tested for lipids and cholesterol every 5 years starting at 52 years of age. If you are at high risk for heart disease, you should start having your blood tested when you are 52 years old. You may need to have your cholesterol levels checked more often if: ? Your lipid or cholesterol levels are high. ? You are older than 52 years of age. ? You are at high risk for heart disease.  What should I know about cancer screening? Many types of cancers can be detected early and may often be prevented. Lung Cancer  You should be screened every year for lung cancer if: ? You are a current smoker who has smoked for at least 30 years. ? You are a former smoker who has quit within the past 15 years.  Talk to your health care provider about your screening options, when you should start screening, and how often you should be screened.  Colorectal Cancer  Routine colorectal cancer screening usually begins at 52 years of age and should be repeated every 5-10 years until you are 52 years old. You may need to be screened more often if early forms of precancerous polyps or small growths are found. Your health care provider may recommend screening at an earlier age if you have risk factors for colon cancer.  Your health care provider may recommend using home test kits to check for hidden blood in the stool.  A small camera at the end of a tube can be used to examine your colon (sigmoidoscopy or colonoscopy). This checks for the earliest forms of colorectal cancer.  Prostate and Testicular Cancer  Depending on your age and overall health, your health care provider may do certain tests to screen for prostate and testicular cancer.  Talk to your health care provider about any symptoms or concerns you have about testicular or prostate cancer.  Skin Cancer  Check your skin  from head to toe regularly.  Tell your health care provider about any new moles or changes in moles, especially if: ? There is a change in a mole's size, shape, or color. ? You have a mole that is larger than a pencil eraser.  Always use sunscreen. Apply sunscreen liberally and repeat throughout the day.  Protect yourself by wearing long sleeves, pants, a wide-brimmed hat, and sunglasses when outside.  What should I know about heart disease, diabetes, and high blood pressure?  If you are 55-57 years of age, have your blood pressure checked every 3-5 years. If you are 46 years of age or older, have your blood pressure checked every year. You should have your blood pressure measured twice-once when you are at a hospital or clinic, and once when you are not at a hospital or clinic. Record the average of the two measurements. To check your blood pressure when you are not at a hospital or clinic, you can use: ? An automated blood pressure machine at a pharmacy. ? A home blood pressure monitor.  Talk to your health care provider about your target  blood pressure.  If you are between 70-42 years old, ask your health care provider if you should take aspirin to prevent heart disease.  Have regular diabetes screenings by checking your fasting blood sugar level. ? If you are at a normal weight and have a low risk for diabetes, have this test once every three years after the age of 31. ? If you are overweight and have a high risk for diabetes, consider being tested at a younger age or more often.  A one-time screening for abdominal aortic aneurysm (AAA) by ultrasound is recommended for men aged 12-75 years who are current or former smokers. What should I know about preventing infection? Hepatitis B If you have a higher risk for hepatitis B, you should be screened for this virus. Talk with your health care provider to find out if you are at risk for hepatitis B infection. Hepatitis C Blood testing is  recommended for:  Everyone born from [birth year] through 1965.  Anyone with known risk factors for hepatitis C.  Sexually Transmitted Diseases (STDs)  You should be screened each year for STDs including gonorrhea and chlamydia if: ? You are sexually active and are younger than 52 years of age. ? You are older than 52 years of age and your health care provider tells you that you are at risk for this type of infection. ? Your sexual activity has changed since you were last screened and you are at an increased risk for chlamydia or gonorrhea. Ask your health care provider if you are at risk.  Talk with your health care provider about whether you are at high risk of being infected with HIV. Your health care provider may recommend a prescription medicine to help prevent HIV infection.  What else can I do?  Schedule regular health, dental, and eye exams.  Stay current with your vaccines (immunizations).  Do not use any tobacco products, such as cigarettes, chewing tobacco, and e-cigarettes. If you need help quitting, ask your health care provider.  Limit alcohol intake to no more than 2 drinks per day. One drink equals 12 ounces of beer, 5 ounces of wine, or 1 ounces of hard liquor.  Do not use street drugs.  Do not share needles.  Ask your health care provider for help if you need support or information about quitting drugs.  Tell your health care provider if you often feel depressed.  Tell your health care provider if you have ever been abused or do not feel safe at home. This information is not intended to replace advice given to you by your health care provider. Make sure you discuss any questions you have with your health care provider. Document Released: 03/18/2008 Document Revised: 05/19/2016 Document Reviewed: 06/24/2015 Elsevier Interactive Patient Education  2018 Elsevier Inc.      Agustina Caroli, MD [facility]

## 2018-05-11 NOTE — Addendum Note (Signed)
Addended by: Davina Poke on: 05/11/2018 09:55 AM   Modules accepted: Orders

## 2018-05-11 NOTE — Patient Instructions (Addendum)
   IF you received an x-ray today, you will receive an invoice from [facility]. Please contact [facility] at [phone] with questions or concerns regarding your invoice.   IF you received labwork today, you will receive an invoice from [facility]. Please contact [facility] at [phone] with questions or concerns regarding your invoice.   Our billing staff will not be able to assist you with questions regarding bills from these companies.  You will be contacted with the lab results as soon as they are available. The fastest way to get your results is to activate your My Chart account. Instructions are located on the last page of this paperwork. If you have not heard from us regarding the results in 2 weeks, please contact this office.      Health Maintenance, Male A healthy lifestyle and preventive care is important for your health and wellness. Ask your health care provider about what schedule of regular examinations is right for you. What should I know about weight and diet? Eat a Healthy Diet  Eat plenty of vegetables, fruits, whole grains, low-fat dairy products, and lean protein.  Do not eat a lot of foods high in solid fats, added sugars, or salt.  Maintain a Healthy Weight Regular exercise can help you achieve or maintain a healthy weight. You should:  Do at least 150 minutes of exercise each week. The exercise should increase your heart rate and make you sweat (moderate-intensity exercise).  Do strength-training exercises at least twice a week.  Watch Your Levels of Cholesterol and Blood Lipids  Have your blood tested for lipids and cholesterol every 5 years starting at 52 years of age. If you are at high risk for heart disease, you should start having your blood tested when you are 52 years old. You may need to have your cholesterol levels checked more often if: ? Your lipid or cholesterol levels are high. ? You are older than 52 years of age. ? You  are at high risk for heart disease.  What should I know about cancer screening? Many types of cancers can be detected early and may often be prevented. Lung Cancer  You should be screened every year for lung cancer if: ? You are a current smoker who has smoked for at least 30 years. ? You are a former smoker who has quit within the past 15 years.  Talk to your health care provider about your screening options, when you should start screening, and how often you should be screened.  Colorectal Cancer  Routine colorectal cancer screening usually begins at 52 years of age and should be repeated every 5-10 years until you are 52 years old. You may need to be screened more often if early forms of precancerous polyps or small growths are found. Your health care provider may recommend screening at an earlier age if you have risk factors for colon cancer.  Your health care provider may recommend using home test kits to check for hidden blood in the stool.  A small camera at the end of a tube can be used to examine your colon (sigmoidoscopy or colonoscopy). This checks for the earliest forms of colorectal cancer.  Prostate and Testicular Cancer  Depending on your age and overall health, your health care provider may do certain tests to screen for prostate and testicular cancer.  Talk to your health care provider about any symptoms or concerns you have about testicular or prostate cancer.  Skin Cancer  Check your skin   from head to toe regularly.  Tell your health care provider about any new moles or changes in moles, especially if: ? There is a change in a mole's size, shape, or color. ? You have a mole that is larger than a pencil eraser.  Always use sunscreen. Apply sunscreen liberally and repeat throughout the day.  Protect yourself by wearing long sleeves, pants, a wide-brimmed hat, and sunglasses when outside.  What should I know about heart disease, diabetes, and high blood  pressure?  If you are 18-39 years of age, have your blood pressure checked every 3-5 years. If you are 40 years of age or older, have your blood pressure checked every year. You should have your blood pressure measured twice-once when you are at a hospital or clinic, and once when you are not at a hospital or clinic. Record the average of the two measurements. To check your blood pressure when you are not at a hospital or clinic, you can use: ? An automated blood pressure machine at a pharmacy. ? A home blood pressure monitor.  Talk to your health care provider about your target blood pressure.  If you are between 45-79 years old, ask your health care provider if you should take aspirin to prevent heart disease.  Have regular diabetes screenings by checking your fasting blood sugar level. ? If you are at a normal weight and have a low risk for diabetes, have this test once every three years after the age of 45. ? If you are overweight and have a high risk for diabetes, consider being tested at a younger age or more often.  A one-time screening for abdominal aortic aneurysm (AAA) by ultrasound is recommended for men aged 65-75 years who are current or former smokers. What should I know about preventing infection? Hepatitis B If you have a higher risk for hepatitis B, you should be screened for this virus. Talk with your health care provider to find out if you are at risk for hepatitis B infection. Hepatitis C Blood testing is recommended for:  Everyone born from 1945 through 1965.  Anyone with known risk factors for hepatitis C.  Sexually Transmitted Diseases (STDs)  You should be screened each year for STDs including gonorrhea and chlamydia if: ? You are sexually active and are younger than 52 years of age. ? You are older than 52 years of age and your health care provider tells you that you are at risk for this type of infection. ? Your sexual activity has changed since you were last  screened and you are at an increased risk for chlamydia or gonorrhea. Ask your health care provider if you are at risk.  Talk with your health care provider about whether you are at high risk of being infected with HIV. Your health care provider may recommend a prescription medicine to help prevent HIV infection.  What else can I do?  Schedule regular health, dental, and eye exams.  Stay current with your vaccines (immunizations).  Do not use any tobacco products, such as cigarettes, chewing tobacco, and e-cigarettes. If you need help quitting, ask your health care provider.  Limit alcohol intake to no more than 2 drinks per day. One drink equals 12 ounces of beer, 5 ounces of wine, or 1 ounces of hard liquor.  Do not use street drugs.  Do not share needles.  Ask your health care provider for help if you need support or information about quitting drugs.  Tell your health care   provider if you often feel depressed.  Tell your health care provider if you have ever been abused or do not feel safe at home. This information is not intended to replace advice given to you by your health care provider. Make sure you discuss any questions you have with your health care provider. Document Released: 03/18/2008 Document Revised: 05/19/2016 Document Reviewed: 06/24/2015 Elsevier Interactive Patient Education  2018 Elsevier Inc.  

## 2018-05-12 LAB — COMPREHENSIVE METABOLIC PANEL
ALT: 23 IU/L (ref 0–44)
AST: 21 IU/L (ref 0–40)
Albumin/Globulin Ratio: 2.1 (ref 1.2–2.2)
Albumin: 4.8 g/dL (ref 3.5–5.5)
Alkaline Phosphatase: 115 IU/L (ref 39–117)
BUN/Creatinine Ratio: 11 (ref 9–20)
BUN: 10 mg/dL (ref 6–24)
Bilirubin Total: 0.3 mg/dL (ref 0.0–1.2)
CO2: 22 mmol/L (ref 20–29)
Calcium: 9.6 mg/dL (ref 8.7–10.2)
Chloride: 102 mmol/L (ref 96–106)
Creatinine, Ser: 0.95 mg/dL (ref 0.76–1.27)
GFR calc Af Amer: 106 mL/min/{1.73_m2} (ref >59)
GFR calc non Af Amer: 92 mL/min/{1.73_m2} (ref >59)
Globulin, Total: 2.3 g/dL (ref 1.5–4.5)
Glucose: 110 mg/dL — ABNORMAL HIGH (ref 65–99)
Potassium: 4.6 mmol/L (ref 3.5–5.2)
Sodium: 139 mmol/L (ref 134–144)
Total Protein: 7.1 g/dL (ref 6.0–8.5)

## 2018-05-12 LAB — CBC WITH DIFFERENTIAL/PLATELET
Basophils Absolute: 0 10*3/uL (ref 0.0–0.2)
Basos: 0 %
EOS (ABSOLUTE): 0 10*3/uL (ref 0.0–0.4)
Eos: 0 %
Hematocrit: 49.3 % (ref 37.5–51.0)
Hemoglobin: 16.7 g/dL (ref 13.0–17.7)
Immature Grans (Abs): 0 10*3/uL (ref 0.0–0.1)
Immature Granulocytes: 0 %
Lymphocytes Absolute: 2 10*3/uL (ref 0.7–3.1)
Lymphs: 21 %
MCH: 30.6 pg (ref 26.6–33.0)
MCHC: 33.9 g/dL (ref 31.5–35.7)
MCV: 90 fL (ref 79–97)
Monocytes Absolute: 1 10*3/uL — ABNORMAL HIGH (ref 0.1–0.9)
Monocytes: 10 %
Neutrophils Absolute: 6.2 10*3/uL (ref 1.4–7.0)
Neutrophils: 69 %
Platelets: 306 10*3/uL (ref 150–450)
RBC: 5.46 x10E6/uL (ref 4.14–5.80)
RDW: 14.5 % (ref 12.3–15.4)
WBC: 9.2 10*3/uL (ref 3.4–10.8)

## 2018-05-12 LAB — PSA: Prostate Specific Ag, Serum: 2.4 ng/mL (ref 0.0–4.0)

## 2018-05-12 LAB — HEMOGLOBIN A1C
Est. average glucose Bld gHb Est-mCnc: 114 mg/dL
Hgb A1c MFr Bld: 5.6 % (ref 4.8–5.6)

## 2018-05-12 LAB — LIPID PANEL
Chol/HDL Ratio: 4.2 ratio (ref 0.0–5.0)
Cholesterol, Total: 187 mg/dL (ref 100–199)
HDL: 45 mg/dL (ref >39)
LDL Calculated: 123 mg/dL — ABNORMAL HIGH (ref 0–99)
Triglycerides: 94 mg/dL (ref 0–149)
VLDL Cholesterol Cal: 19 mg/dL (ref 5–40)

## 2018-05-13 ENCOUNTER — Encounter: Payer: Self-pay | Admitting: Radiology

## 2018-07-12 DIAGNOSIS — F411 Generalized anxiety disorder: Secondary | ICD-10-CM | POA: Diagnosis not present

## 2018-07-19 ENCOUNTER — Other Ambulatory Visit: Payer: Self-pay | Admitting: Family Medicine

## 2018-08-09 DIAGNOSIS — F411 Generalized anxiety disorder: Secondary | ICD-10-CM | POA: Diagnosis not present

## 2018-08-14 ENCOUNTER — Other Ambulatory Visit: Payer: Self-pay | Admitting: Family Medicine

## 2018-11-21 ENCOUNTER — Other Ambulatory Visit: Payer: Self-pay | Admitting: Emergency Medicine

## 2018-11-21 DIAGNOSIS — F411 Generalized anxiety disorder: Secondary | ICD-10-CM

## 2018-11-21 NOTE — Telephone Encounter (Signed)
Requested medication (s) are due for refill today: yes  Requested medication (s) are on the active medication list: yes  Last refill:  05/11/18  Future visit scheduled: no  Notes to clinic: not delegated    Requested Prescriptions  Pending Prescriptions Disp Refills   ALPRAZolam (XANAX) 1 MG tablet [Pharmacy Med Name: ALPRAZOLAM 1 MG TABLET] 20 tablet     Sig: TAKE 1/2 A TABLET BY MOUTH TWICE A DAY AS NEEDED FOR ANXIETY     Not Delegated - Psychiatry:  Anxiolytics/Hypnotics Failed - 11/21/2018  1:32 PM      Failed - This refill cannot be delegated      Failed - Urine Drug Screen completed in last 360 days.      Failed - Valid encounter within last 6 months    Recent Outpatient Visits          6 months ago Routine general medical examination at a health care facility   Primary Care at [facility], Ines Bloomer, MD   2 years ago Annual physical exam   [facility] at [facility], Laurey Arrow, MD   3 years ago Annual physical exam   Primary Care at [facility], Fenton Malling, MD   4 years ago OSA on CPAP   [facility] at [facility], Laurey Arrow, MD   5 years ago Need for prophylactic vaccination and inoculation against influenza   Primary Care at [facility], Fenton Malling, MD

## 2018-11-23 NOTE — Telephone Encounter (Signed)
Please advise on refill request. Pt has not been seen since 05/2018

## 2019-06-07 ENCOUNTER — Other Ambulatory Visit: Payer: Self-pay | Admitting: Emergency Medicine

## 2019-06-07 DIAGNOSIS — K2270 Barrett's esophagus without dysplasia: Secondary | ICD-10-CM

## 2019-06-07 NOTE — Telephone Encounter (Signed)
Forwarding medication refill requests to the clinical pool for review. 

## 2019-06-13 DIAGNOSIS — L820 Inflamed seborrheic keratosis: Secondary | ICD-10-CM | POA: Diagnosis not present

## 2019-06-14 ENCOUNTER — Encounter: Payer: BLUE CROSS/BLUE SHIELD | Admitting: Family Medicine

## 2019-06-15 ENCOUNTER — Encounter: Payer: Self-pay | Admitting: Family Medicine

## 2019-06-21 ENCOUNTER — Encounter: Payer: Self-pay | Admitting: Family Medicine

## 2019-06-21 ENCOUNTER — Ambulatory Visit (INDEPENDENT_AMBULATORY_CARE_PROVIDER_SITE_OTHER): Payer: BC Managed Care – PPO | Admitting: Family Medicine

## 2019-06-21 ENCOUNTER — Other Ambulatory Visit: Payer: Self-pay

## 2019-06-21 VITALS — BP 142/97 | HR 79 | Temp 98.2°F | Resp 18 | Ht 69.0 in | Wt 205.8 lb

## 2019-06-21 DIAGNOSIS — Z0001 Encounter for general adult medical examination with abnormal findings: Secondary | ICD-10-CM | POA: Diagnosis not present

## 2019-06-21 DIAGNOSIS — G4733 Obstructive sleep apnea (adult) (pediatric): Secondary | ICD-10-CM

## 2019-06-21 DIAGNOSIS — K2270 Barrett's esophagus without dysplasia: Secondary | ICD-10-CM

## 2019-06-21 DIAGNOSIS — Z23 Encounter for immunization: Secondary | ICD-10-CM

## 2019-06-21 DIAGNOSIS — G479 Sleep disorder, unspecified: Secondary | ICD-10-CM

## 2019-06-21 DIAGNOSIS — I10 Essential (primary) hypertension: Secondary | ICD-10-CM

## 2019-06-21 DIAGNOSIS — F411 Generalized anxiety disorder: Secondary | ICD-10-CM

## 2019-06-21 DIAGNOSIS — B078 Other viral warts: Secondary | ICD-10-CM

## 2019-06-21 DIAGNOSIS — E785 Hyperlipidemia, unspecified: Secondary | ICD-10-CM

## 2019-06-21 DIAGNOSIS — Z0000 Encounter for general adult medical examination without abnormal findings: Secondary | ICD-10-CM | POA: Diagnosis not present

## 2019-06-21 LAB — POC HEMOCCULT BLD/STL (OFFICE/1-CARD/DIAGNOSTIC): Fecal Occult Blood, POC: NEGATIVE

## 2019-06-21 MED ORDER — LISINOPRIL 10 MG PO TABS: 10.0000 mg | ORAL_TABLET | Freq: Every day | ORAL | 3 refills | Status: DC

## 2019-06-21 MED ORDER — ZOLPIDEM TARTRATE 5 MG PO TABS: 5.0000 mg | ORAL_TABLET | Freq: Every evening | ORAL | 2 refills | Status: DC | PRN

## 2019-06-21 MED ORDER — ALPRAZOLAM 1 MG PO TABS: ORAL_TABLET | ORAL | 1 refills | Status: DC

## 2019-06-21 MED ORDER — OMEPRAZOLE 20 MG PO CPDR: 20.0000 mg | DELAYED_RELEASE_CAPSULE | Freq: Two times a day (BID) | ORAL | 3 refills | Status: DC

## 2019-06-21 MED ORDER — BUPROPION HCL ER (XL) 300 MG PO TB24: 300.0000 mg | ORAL_TABLET | Freq: Every day | ORAL | 3 refills | Status: DC

## 2019-06-21 NOTE — Patient Instructions (Signed)
Continue seeking to be healthy as a whole person: Physically, emotionally, relationally, and spiritually  Get regular exercise  Try to watch her eating  Continue same medication regimen  Referral is been made for gastroenterology to get your colonoscopy and upper endoscopy.  Return annually or as needed  Monitor your blood pressure to make sure it does not drift upward

## 2019-06-21 NOTE — Progress Notes (Signed)
Patient ID: [name], male    DOB: 1966-05-27  Age: 53 y.o. MRN: [id number]  Chief Complaint  Patient presents with  . Annual Exam    Subjective:   Patient ID: [name], male    DOB: April 19, 1966  Age: 53 y.o. MRN: [id number]  Chief Complaint  Patient presents with  . Annual Exam    Subjective:   Patient is here for his annual physical examination.  I saw him about 3 or 4 years ago.  He basically is doing well.  He needs some refills but no other major acute problems.  He needs a referral to gastroenterology.  He has a history of Barrett's esophagus and needs to get back to the gastroenterologist for an EGD.  He is also over 10 and has never had a colonoscopy.  He has anxiety with panic attacks.  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  He was stressed out more earlier this year with the business concerns, and had more problems then but currently he is doing well.  He takes bupropion 300 mg daily for that, and as needed Xanax.  He usually takes a half of a Xanax and 60 pills usually lasts him for a year.  He rarely takes Ambien.  He continues on his cholesterol and blood pressure medicine.  Home medications: Omeprazole 22 daily bupropion 300 mg daily lisinopril 10 daily simvastatin 20 daily zolpidem at bedtime as needed Xanax one half as needed Allergies: Ampicillin, thimerosal,thathalate? Medical history: Anxiety, depression, GERD, hypertension, hyperlipidemia  Surgeries: LASEK eye surgery Uvulectomy Sinus surgery   Family history: Mother is living, 21 years old, hypertension Father was an alcoholic, died with cancer diabetes, heart disease, high cholesterol, and hypertension. 2 sisters living and well have hypertension  Social history: Does not smoke.  Drinks about 1 beer 4 times a, I pattern he has had for many many years without increasing.  No drug use.  He is married.  Attends [employer].  Does not get much regular exercise.  Review of systems:  Constitutional: Unremarkable HEENT: Unremarkable Cardiovascular: Unremarkable Respiratory: Unremarkable GI: Unremarkable.  No symptoms related to his Barrett's esophagus.  Never has a colonoscopy. GU: Unremarkable Musculoskeletal: Unremarkable Dermatologic: Unremarkable except for having recently had a place present on his left temple.  He has a wart on his left arm he wants to show me.  Current allergies, medications, problem list, past/family and social histories reviewed.  Objective:  BP (!) 142/97 (BP Location: Right Arm, Patient Position: Sitting, Cuff Size: Normal)   Pulse 79   Temp 98.2 F (36.8 C) (Oral)   Resp 18   Ht 5\' 9"  (1.753 m)   Wt 205 lb 12.8 oz (93.4 kg)   SpO2 98%   BMI 30.39 kg/m   Healthy man in no acute distress.  TMs normal.  Eyes PRL.  Actually did have some wax in his ear canals, more on the left.  Tympanosclerosis of the right drum.  Throat clear.  Neck supple without nodes or thyromegaly.  No carotid bruits.  Chest clear to auscultation.  Heart regular without murmurs.  M soft not mass or tenderness.  Normal male external genitalia, circumcised, no testicular lesions.  Digital rectal exam reveals prostate gland to be normal.  Hemoccult taken.  Extremities unremarkable.  Skin unremarkable except for a little wart on his left arm which we talked about.  Assessment & Plan:   Assessment: 1. Annual physical exam   2. Generalized anxiety disorder   3. Barrett's esophagus  without dysplasia   4. Sleep disturbance   5. Hyperlipidemia, unspecified hyperlipidemia type   6. Obstructive sleep apnea syndrome   7. Other viral warts   8. Essential hypertension   9. Needs flu shot   10. Need for immunization against influenza       Plan: See instructions.  Orders Placed This Encounter  Procedures  . Flu Vaccine QUAD 36+ mos IM  . Comprehensive metabolic panel  . Lipid panel  . TSH  . PSA  . CBC  . Hepatitis c antibody (reflex)  . Ambulatory referral to  Gastroenterology    Referral Priority:   Routine    Referral Type:   Consultation    Referral Reason:   Specialty Services Required    Referred to Provider:   Ladene Artist, MD    Number of Visits Requested:   1  . POC Hemoccult Bld/Stl (1-Cd Office Dx)    Meds ordered this encounter  Medications  . ALPRAZolam (XANAX) 1 MG tablet    Sig: TAKE 1/2 A TABLET BY MOUTH DAILY AS NEEDED FOR ANXIETY    Dispense:  30 tablet    Refill:  1    This request is for a new prescription for a controlled substance as required by Federal/State law.  Marland Kitchen buPROPion (WELLBUTRIN XL) 300 MG 24 hr tablet    Sig: Take 1 tablet (300 mg total) by mouth daily. Needs office visit last refill    Dispense:  90 tablet    Refill:  3  . lisinopril (ZESTRIL) 10 MG tablet    Sig: Take 1 tablet (10 mg total) by mouth daily.    Dispense:  90 tablet    Refill:  3  . omeprazole (PRILOSEC) 20 MG capsule    Sig: Take 1 capsule (20 mg total) by mouth 2 (two) times daily before a meal.    Dispense:  180 capsule    Refill:  3  . zolpidem (AMBIEN) 5 MG tablet    Sig: Take 1 tablet (5 mg total) by mouth at bedtime as needed.    Dispense:  30 tablet    Refill:  2         Patient Instructions  Continue seeking to be healthy as a whole person: Physically, emotionally, relationally, and spiritually  Get regular exercise  Try to watch her eating  Continue same medication regimen  Referral is been made for gastroenterology to get your colonoscopy and upper endoscopy.  Return annually or as needed  Monitor your blood pressure to make sure it does not drift upward      Return in about 1 year (around 06/20/2020).   Ruben Reason, MD [id number] Duplicate  Current allergies, medications, problem list, past/family and social histories reviewed.  Objective:  BP (!) 142/97 (BP Location: Right Arm, Patient Position: Sitting, Cuff Size: Normal)   Pulse 79   Temp 98.2 F (36.8 C) (Oral)   Resp 18   Ht 5\' 9"   (1.753 m)   Wt 205 lb 12.8 oz (93.4 kg)   SpO2 98%   BMI 123456 kg/m   Duplicate  Assessment & Plan:   Assessment: 1. Annual physical exam   2. Generalized anxiety disorder   3. Barrett's esophagus without dysplasia   4. Sleep disturbance   5. Hyperlipidemia, unspecified hyperlipidemia type   6. Obstructive sleep apnea syndrome   7. Other viral warts   8. Essential hypertension   9. Needs flu shot   10. Need for immunization against  influenza       Plan: Duplicate somehow got in this chart.  Orders Placed This Encounter  Procedures  . Flu Vaccine QUAD 36+ mos IM  . Comprehensive metabolic panel  . Lipid panel  . TSH  . PSA  . CBC  . Hepatitis c antibody (reflex)  . Ambulatory referral to Gastroenterology    Referral Priority:   Routine    Referral Type:   Consultation    Referral Reason:   Specialty Services Required    Referred to Provider:   Ladene Artist, MD    Number of Visits Requested:   1  . POC Hemoccult Bld/Stl (1-Cd Office Dx)    Meds ordered this encounter  Medications  . ALPRAZolam (XANAX) 1 MG tablet    Sig: TAKE 1/2 A TABLET BY MOUTH DAILY AS NEEDED FOR ANXIETY    Dispense:  30 tablet    Refill:  1    This request is for a new prescription for a controlled substance as required by Federal/State law.  Marland Kitchen buPROPion (WELLBUTRIN XL) 300 MG 24 hr tablet    Sig: Take 1 tablet (300 mg total) by mouth daily. Needs office visit last refill    Dispense:  90 tablet    Refill:  3  . lisinopril (ZESTRIL) 10 MG tablet    Sig: Take 1 tablet (10 mg total) by mouth daily.    Dispense:  90 tablet    Refill:  3  . omeprazole (PRILOSEC) 20 MG capsule    Sig: Take 1 capsule (20 mg total) by mouth 2 (two) times daily before a meal.    Dispense:  180 capsule    Refill:  3  . zolpidem (AMBIEN) 5 MG tablet    Sig: Take 1 tablet (5 mg total) by mouth at bedtime as needed.    Dispense:  30 tablet    Refill:  2         Patient Instructions  Continue  seeking to be healthy as a whole person: Physically, emotionally, relationally, and spiritually  Get regular exercise  Try to watch her eating  Continue same medication regimen  Referral is been made for gastroenterology to get your colonoscopy and upper endoscopy.  Return annually or as needed  Monitor your blood pressure to make sure it does not drift upward      Return in about 1 year (around 06/20/2020).   Ruben Reason, MD 06/21/2019

## 2019-06-22 LAB — PSA: Prostate Specific Ag, Serum: 2.3 ng/mL (ref 0.0–4.0)

## 2019-06-22 LAB — COMPREHENSIVE METABOLIC PANEL
ALT: 19 IU/L (ref 0–44)
AST: 19 IU/L (ref 0–40)
Albumin/Globulin Ratio: 2 (ref 1.2–2.2)
Albumin: 4.7 g/dL (ref 3.8–4.9)
Alkaline Phosphatase: 110 IU/L (ref 39–117)
BUN/Creatinine Ratio: 11 (ref 9–20)
BUN: 10 mg/dL (ref 6–24)
Bilirubin Total: 0.5 mg/dL (ref 0.0–1.2)
CO2: 23 mmol/L (ref 20–29)
Calcium: 9.3 mg/dL (ref 8.7–10.2)
Chloride: 101 mmol/L (ref 96–106)
Creatinine, Ser: 0.88 mg/dL (ref 0.76–1.27)
GFR calc Af Amer: 113 mL/min/{1.73_m2} (ref >59)
GFR calc non Af Amer: 98 mL/min/{1.73_m2} (ref >59)
Globulin, Total: 2.3 g/dL (ref 1.5–4.5)
Glucose: 100 mg/dL — ABNORMAL HIGH (ref 65–99)
Potassium: 4.2 mmol/L (ref 3.5–5.2)
Sodium: 138 mmol/L (ref 134–144)
Total Protein: 7 g/dL (ref 6.0–8.5)

## 2019-06-22 LAB — TSH: TSH: 0.908 u[IU]/mL (ref 0.450–4.500)

## 2019-06-22 LAB — CBC
Hematocrit: 47.5 % (ref 37.5–51.0)
Hemoglobin: 15.9 g/dL (ref 13.0–17.7)
MCH: 30 pg (ref 26.6–33.0)
MCHC: 33.5 g/dL (ref 31.5–35.7)
MCV: 90 fL (ref 79–97)
Platelets: 286 10*3/uL (ref 150–450)
RBC: 5.3 x10E6/uL (ref 4.14–5.80)
RDW: 13.6 % (ref 11.6–15.4)
WBC: 8.3 10*3/uL (ref 3.4–10.8)

## 2019-06-22 LAB — LIPID PANEL
Chol/HDL Ratio: 3.7 ratio (ref 0.0–5.0)
Cholesterol, Total: 157 mg/dL (ref 100–199)
HDL: 42 mg/dL (ref >39)
LDL Chol Calc (NIH): 96 mg/dL (ref 0–99)
Triglycerides: 106 mg/dL (ref 0–149)
VLDL Cholesterol Cal: 19 mg/dL (ref 5–40)

## 2019-06-22 LAB — HCV COMMENT:

## 2019-06-22 LAB — HEPATITIS C ANTIBODY (REFLEX): HCV Ab: <0.1 s/co ratio (ref 0.0–0.9)

## 2019-06-25 ENCOUNTER — Encounter: Payer: Self-pay | Admitting: Radiology

## 2019-06-26 ENCOUNTER — Encounter: Payer: Self-pay | Admitting: Radiology

## 2019-07-23 ENCOUNTER — Encounter: Payer: Self-pay | Admitting: Family Medicine

## 2019-08-13 DIAGNOSIS — Z20828 Contact with and (suspected) exposure to other viral communicable diseases: Secondary | ICD-10-CM | POA: Diagnosis not present

## 2019-08-15 ENCOUNTER — Other Ambulatory Visit: Payer: Self-pay | Admitting: Family Medicine

## 2019-08-15 DIAGNOSIS — F411 Generalized anxiety disorder: Secondary | ICD-10-CM

## 2019-08-15 NOTE — Telephone Encounter (Signed)
Requested medication (s) are due for refill today: yes  Requested medication (s) are on the active medication list: yes  Last refill:  07/21/2019  Future visit scheduled: no  Notes to clinic:  Patient requesting 90 day supply    Requested Prescriptions  Pending Prescriptions Disp Refills   buPROPion (WELLBUTRIN XL) 300 MG 24 hr tablet [Pharmacy Med Name: BUPROPION HCL XL 300 MG TABLET] 90 tablet 4    Sig: TAKE 1 TABLET (300 MG TOTAL) BY MOUTH DAILY.     Psychiatry: Antidepressants - bupropion Failed - 08/15/2019 12:30 PM      Failed - Last BP in normal range    BP Readings from Last 1 Encounters:  06/21/19 (!) 142/97         Failed - Completed PHQ-2 or PHQ-9 in the last 360 days.      Passed - Valid encounter within last 6 months    Recent Outpatient Visits          1 month ago Annual physical exam   Primary Care at [facility], Fenton Malling, MD   1 year ago Routine general medical examination at a health care facility   Primary Care at [facility], Ines Bloomer, MD   2 years ago Annual physical exam   [facility] at [facility], Laurey Arrow, MD   4 years ago Annual physical exam   Primary Care at [facility], Fenton Malling, MD   5 years ago OSA on CPAP   Primary Care at [facility], Laurey Arrow, MD

## 2019-08-21 NOTE — Telephone Encounter (Signed)
Pt requesting medication 

## 2019-12-11 ENCOUNTER — Telehealth: Payer: Self-pay | Admitting: *Deleted

## 2019-12-11 ENCOUNTER — Other Ambulatory Visit: Payer: Self-pay

## 2019-12-11 ENCOUNTER — Ambulatory Visit: Payer: BC Managed Care – PPO | Admitting: Emergency Medicine

## 2019-12-11 ENCOUNTER — Encounter: Payer: Self-pay | Admitting: Emergency Medicine

## 2019-12-11 VITALS — BP 133/90 | HR 81 | Temp 98.7°F | Resp 16 | Ht 69.0 in | Wt 202.0 lb

## 2019-12-11 DIAGNOSIS — Z1211 Encounter for screening for malignant neoplasm of colon: Secondary | ICD-10-CM | POA: Diagnosis not present

## 2019-12-11 DIAGNOSIS — L03011 Cellulitis of right finger: Secondary | ICD-10-CM

## 2019-12-11 DIAGNOSIS — Z23 Encounter for immunization: Secondary | ICD-10-CM | POA: Diagnosis not present

## 2019-12-11 MED ORDER — DOXYCYCLINE HYCLATE 100 MG PO TABS: 100.0000 mg | ORAL_TABLET | Freq: Two times a day (BID) | ORAL | 0 refills | Status: AC

## 2019-12-11 NOTE — Progress Notes (Signed)
[facility] 54 y.o.   Chief Complaint  Patient presents with  . Hand Pain    started yesterday - RIGHT 3rd finger    HISTORY OF PRESENT ILLNESS: This is a 54 y.o. male complaining of pain and swelling to right middle finger for the past several days.  HPI   Prior to Admission medications   Medication Sig Start Date End Date Taking? Authorizing Provider  ALPRAZolam Duanne Moron) 1 MG tablet TAKE 1/2 A TABLET BY MOUTH DAILY AS NEEDED FOR ANXIETY 06/21/19  Yes Posey Boyer, MD  aspirin EC 81 MG tablet Take 81 mg by mouth daily.   Yes [provider]  buPROPion (WELLBUTRIN XL) 300 MG 24 hr tablet TAKE 1 TABLET (300 MG TOTAL) BY MOUTH DAILY. 08/22/19  Yes Armin Yerger, Ines Bloomer, MD  Multiple Vitamin (MULTIVITAMIN) tablet Take 1 tablet by mouth daily.   Yes [provider]  niacin 500 MG tablet Take 1 tablet (500 mg total) by mouth daily with breakfast. 05/11/18  Yes Graiden Henes, Ines Bloomer, MD  Omega-3 Fatty Acids (FISH OIL) 500 MG CAPS Take by mouth daily.   Yes [provider]  omeprazole (PRILOSEC) 20 MG capsule Take 1 capsule (20 mg total) by mouth 2 (two) times daily before a meal. 06/21/19  Yes Posey Boyer, MD  zolpidem (AMBIEN) 5 MG tablet Take 1 tablet (5 mg total) by mouth at bedtime as needed. 06/21/19  Yes Posey Boyer, MD  lisinopril (ZESTRIL) 10 MG tablet Take 1 tablet (10 mg total) by mouth daily. 06/21/19 09/19/19  Posey Boyer, MD  Na Sulfate-K Sulfate-Mg Sulf 17.5-3.13-1.6 GM/180ML SOLN Suprep as directed.  No substitutions. 01/14/17   Ladene Artist, MD    Allergies  Allergen Reactions  . Thimerosal Swelling  . Ampicillin Rash    Childhood.  . Iodine Rash    Child hood.    Patient Active Problem List   Diagnosis Date Noted  . Skin lesion of face 05/11/2018  . History of sleep apnea 05/11/2018  . Generalized anxiety disorder 05/11/2018  . Sleep disturbance 05/11/2018  . History of Barrett's esophagus 05/11/2018  . Abnormal EKG  11/24/2016  . Smoker 11/24/2016  . Family history of coronary artery disease 11/24/2016  . Hyperlipemia 08/09/2015  . Gastroesophageal reflux disease without esophagitis 08/09/2015  . Essential hypertension 08/09/2015  . HTN (hypertension) 08/22/2012  . Anxiety 08/22/2012  . Barrett's esophagus without dysplasia 08/22/2012  . OSA on CPAP     Past Medical History:  Diagnosis Date  . Allergy    seasonal  . Anxiety   . Barrett's esophagus   . Depression   . GERD (gastroesophageal reflux disease) 12/2007  . HLD (hyperlipidemia)   . Hypertension   . OSA on CPAP   . Sleep apnea    Bipap    Past Surgical History:  Procedure Laterality Date  . NASAL SINUS SURGERY    . REFRACTIVE SURGERY     bilateral  . UPPER GASTROINTESTINAL ENDOSCOPY    . UVULECTOMY      Social History   Socioeconomic History  . Marital status: Married    Spouse name: Sherrie Mustache  . Number of children: 2  . Years of education: 81  . Highest education level: Not on file  Occupational History  . Occupation: Child psychotherapist: [employer]  Tobacco Use  . Smoking status: Former Smoker    Types: Cigarettes    Quit date: 01/19/2007    Years  since quitting: 12.9  . Smokeless tobacco: Never Used  Substance and Sexual Activity  . Alcohol use: Yes    Alcohol/week: 0.0 - 5.0 standard drinks    Comment: usually once a month  . Drug use: No  . Sexual activity: Yes    Partners: Female  Other Topics Concern  . Not on file  Social History Narrative   Lives with wife (travelling nurse, gone about half the time) from whom he is separated, but on good terms.  Shares custody of his daughters with his first wife.  His current wife has two sons, one grown, the other lives with his father.   Social Determinants of Health   Financial Resource Strain:   . Difficulty of Paying Living Expenses: Not on file  Food Insecurity:   . Worried About Charity fundraiser in the Last Year: Not on  file  . Ran Out of Food in the Last Year: Not on file  Transportation Needs:   . Lack of Transportation (Medical): Not on file  . Lack of Transportation (Non-Medical): Not on file  Physical Activity:   . Days of Exercise per Week: Not on file  . Minutes of Exercise per Session: Not on file  Stress:   . Feeling of Stress : Not on file  Social Connections:   . Frequency of Communication with Friends and Family: Not on file  . Frequency of Social Gatherings with Friends and Family: Not on file  . Attends Religious Services: Not on file  . Active Member of Clubs or Organizations: Not on file  . Attends Archivist Meetings: Not on file  . Marital Status: Not on file  Intimate Partner Violence:   . Fear of Current or Ex-Partner: Not on file  . Emotionally Abused: Not on file  . Physically Abused: Not on file  . Sexually Abused: Not on file    Family History  Problem Relation Age of Onset  . Glaucoma Mother   . Cataracts Mother        complicated surgery  . Prostate cancer Father        metastatic  . Hypertension Father   . Alcohol abuse Father   . Heart disease Father   . Diabetes Father   . Cirrhosis Father   . Gout Father   . Arthritis Father   . Anxiety disorder Sister   . Hypertension Sister   . Glaucoma Paternal Uncle   . Heart disease Paternal Uncle   . Alcohol abuse Paternal Uncle   . Colon cancer Paternal Uncle   . Heart disease Paternal Uncle   . Multiple sclerosis Maternal Aunt   . Brain cancer Maternal Uncle   . Alcohol abuse Maternal Uncle      Review of Systems  Constitutional: Negative.  Negative for chills and fever.  HENT: Negative.  Negative for congestion and sore throat.   Respiratory: Negative.  Negative for cough and shortness of breath.   Cardiovascular: Negative.  Negative for chest pain and palpitations.  Gastrointestinal: Negative.  Negative for abdominal pain, diarrhea, nausea and vomiting.  Neurological: Negative.  Negative for  dizziness and headaches.  All other systems reviewed and are negative.  Today's Vitals   12/11/19 1120  BP: 133/90  Pulse: 81  Resp: 16  Temp: 98.7 F (37.1 C)  TempSrc: Temporal  SpO2: 97%  Weight: 202 lb (91.6 kg)  Height: 5\' 9"  (1.753 m)   Body mass index is 29.83 kg/m.   Physical Exam  Constitutional:      Appearance: Normal appearance.  HENT:     Head: Normocephalic and atraumatic.  Eyes:     Extraocular Movements: Extraocular movements intact.     Pupils: Pupils are equal, round, and reactive to light.  Cardiovascular:     Rate and Rhythm: Normal rate.  Pulmonary:     Effort: Pulmonary effort is normal.  Musculoskeletal:     Cervical back: Normal range of motion.     Comments: Right middle finger: Moderate size paronychia with surrounding cellulitis.  No sign of felon.  Skin:    General: Skin is warm and dry.     Capillary Refill: Capillary refill takes less than 2 seconds.  Neurological:     General: No focal deficit present.     Mental Status: He is alert and oriented to person, place, and time.  Psychiatric:        Mood and Affect: Mood normal.        Behavior: Behavior normal.    Procedure note: Finger block with plain 1% lidocaine none.  Paronychia drained and moderate amount of purulent material drained.  Xeroform packing inserted and finger wrapped with sterile gauze dressing.  Patient tolerated procedure well.  No complications. We will get patient started on antibiotics, allergic to amoxicillin, doxycycline twice a day.  ASSESSMENT & PLAN: [name] was seen today for hand pain.  Diagnoses and all orders for this visit:  Paronychia of right middle finger  Cellulitis of finger of right hand -     doxycycline (VIBRA-TABS) 100 MG tablet; Take 1 tablet (100 mg total) by mouth 2 (two) times daily for 7 days.  Screening for colon cancer -     Cologuard    Patient Instructions       If you have lab work done today you will be contacted with  your lab results within the next 2 weeks.  If you have not heard from [place] then please contact us. The fastest way to get your results is to register for My Chart.   IF you received an x-ray today, you will receive an invoice from [facility]. Please contact [facility] at [phone] with questions or concerns regarding your invoice.   IF you received labwork today, you will receive an invoice from [facility]. Please contact [facility] at [phone] with questions or concerns regarding your invoice.   Our billing staff will not be able to assist you with questions regarding bills from these companies.  You will be contacted with the lab results as soon as they are available. The fastest way to get your results is to activate your My Chart account. Instructions are located on the last page of this paperwork. If you have not heard from [place] regarding the results in 2 weeks, please contact this office.     Paronychia Paronychia is an infection of the skin. It happens near a fingernail or toenail. It may cause pain and swelling around the nail. In some cases, a fluid-filled bump (abscess) can form near or under the nail. Usually, this condition is not serious, and it clears up with treatment. Follow these instructions at home: Wound care  Keep the affected area clean.  Soak the fingers or toes in warm water as told by your doctor. You may be told to do this for 20 minutes, 2-3 times a day.  Keep the area dry when you are not soaking it.  Do not try to drain a fluid-filled bump on your own.  Follow instructions from your doctor about how to take care of the affected area. Make sure you: ? Wash your hands with soap and water before you change your bandage (dressing). If you cannot use soap and water, use hand sanitizer. ? Change your bandage as told by your doctor.  If you had a fluid-filled bump and your doctor drained it, check the area every day for signs of infection. Check  for: ? Redness, swelling, or pain. ? Fluid or blood. ? Warmth. ? Pus or a bad smell. Medicines   Take over-the-counter and prescription medicines only as told by your doctor.  If you were prescribed an antibiotic medicine, take it as told by your doctor. Do not stop taking it even if you start to feel better. General instructions  Avoid touching any chemicals.  Do not pick at the affected area. Prevention  To prevent this condition from happening again: ? Wear rubber gloves when putting your hands in water for washing dishes or other tasks. ? Wear gloves if your hands might touch cleaners or chemicals. ? Avoid injuring your nails or fingertips. ? Do not bite your nails or tear hangnails. ? Do not cut your nails very short. ? Do not cut the skin at the base and sides of the nail (cuticles). ? Use clean nail clippers or scissors when trimming nails. Contact a doctor if:  You feel worse.  You do not get better.  You have more fluid, blood, or pus coming from the affected area.  Your finger or knuckle is swollen or is hard to move. Get help right away if you have:  A fever or chills.  Redness spreading from the affected area.  Pain in a joint or muscle. Summary  Paronychia is an infection of the skin. It happens near a fingernail or toenail.  This condition may cause pain and swelling around the nail.  Soak the fingers or toes in warm water as told by your doctor.  Usually, this condition is not serious, and it clears up with treatment. This information is not intended to replace advice given to you by your health care provider. Make sure you discuss any questions you have with your health care provider. Document Revised: 10/07/2017 Document Reviewed: 10/03/2017 Elsevier Patient Education  2020 Elsevier Inc.      Agustina Caroli, MD [facility]

## 2019-12-11 NOTE — Addendum Note (Signed)
Addended by: Alfredia Ferguson A on: 12/11/2019 12:27 PM   Modules accepted: Orders

## 2019-12-11 NOTE — Patient Instructions (Addendum)
If you have lab work done today you will be contacted with your lab results within the next 2 weeks.  If you have not heard from [place] then please contact us. The fastest way to get your results is to register for My Chart.   IF you received an x-ray today, you will receive an invoice from [facility]. Please contact [facility] at [phone] with questions or concerns regarding your invoice.   IF you received labwork today, you will receive an invoice from [place]. Please contact [facility] at [phone] with questions or concerns regarding your invoice.   Our billing staff will not be able to assist you with questions regarding bills from these companies.  You will be contacted with the lab results as soon as they are available. The fastest way to get your results is to activate your My Chart account. Instructions are located on the last page of this paperwork. If you have not heard from [place] regarding the results in 2 weeks, please contact this office.     Paronychia Paronychia is an infection of the skin. It happens near a fingernail or toenail. It may cause pain and swelling around the nail. In some cases, a fluid-filled bump (abscess) can form near or under the nail. Usually, this condition is not serious, and it clears up with treatment. Follow these instructions at home: Wound care  Keep the affected area clean.  Soak the fingers or toes in warm water as told by your doctor. You may be told to do this for 20 minutes, 2-3 times a day.  Keep the area dry when you are not soaking it.  Do not try to drain a fluid-filled bump on your own.  Follow instructions from your doctor about how to take care of the affected area. Make sure you: ? Wash your hands with soap and water before you change your bandage (dressing). If you cannot use soap and water, use hand sanitizer. ? Change your bandage as told by your doctor.  If you had a fluid-filled bump and your doctor  drained it, check the area every day for signs of infection. Check for: ? Redness, swelling, or pain. ? Fluid or blood. ? Warmth. ? Pus or a bad smell. Medicines   Take over-the-counter and prescription medicines only as told by your doctor.  If you were prescribed an antibiotic medicine, take it as told by your doctor. Do not stop taking it even if you start to feel better. General instructions  Avoid touching any chemicals.  Do not pick at the affected area. Prevention  To prevent this condition from happening again: ? Wear rubber gloves when putting your hands in water for washing dishes or other tasks. ? Wear gloves if your hands might touch cleaners or chemicals. ? Avoid injuring your nails or fingertips. ? Do not bite your nails or tear hangnails. ? Do not cut your nails very short. ? Do not cut the skin at the base and sides of the nail (cuticles). ? Use clean nail clippers or scissors when trimming nails. Contact a doctor if:  You feel worse.  You do not get better.  You have more fluid, blood, or pus coming from the affected area.  Your finger or knuckle is swollen or is hard to move. Get help right away if you have:  A fever or chills.  Redness spreading from the affected area.  Pain in a joint or muscle. Summary  Paronychia is an infection  of the skin. It happens near a fingernail or toenail.  This condition may cause pain and swelling around the nail.  Soak the fingers or toes in warm water as told by your doctor.  Usually, this condition is not serious, and it clears up with treatment. This information is not intended to replace advice given to you by your health care provider. Make sure you discuss any questions you have with your health care provider. Document Revised: 10/07/2017 Document Reviewed: 10/03/2017 Elsevier Patient Education  2020 [employer].

## 2019-12-11 NOTE — Telephone Encounter (Signed)
Faxed Cologuard request to [facility]. Confirmation page 5:17pm.

## 2020-02-18 ENCOUNTER — Ambulatory Visit: Payer: Self-pay | Attending: Internal Medicine

## 2020-03-27 ENCOUNTER — Other Ambulatory Visit: Payer: Self-pay

## 2020-03-27 ENCOUNTER — Ambulatory Visit: Payer: BC Managed Care – PPO | Admitting: Family Medicine

## 2020-03-27 ENCOUNTER — Encounter: Payer: Self-pay | Admitting: Family Medicine

## 2020-03-27 VITALS — BP 132/88 | HR 85 | Temp 98.2°F | Resp 16 | Ht 69.0 in | Wt 206.8 lb

## 2020-03-27 DIAGNOSIS — F411 Generalized anxiety disorder: Secondary | ICD-10-CM

## 2020-03-27 DIAGNOSIS — R1033 Periumbilical pain: Secondary | ICD-10-CM

## 2020-03-27 DIAGNOSIS — I10 Essential (primary) hypertension: Secondary | ICD-10-CM

## 2020-03-27 DIAGNOSIS — E782 Mixed hyperlipidemia: Secondary | ICD-10-CM

## 2020-03-27 DIAGNOSIS — K2270 Barrett's esophagus without dysplasia: Secondary | ICD-10-CM | POA: Diagnosis not present

## 2020-03-27 MED ORDER — BUPROPION HCL ER (XL) 300 MG PO TB24: 300.0000 mg | ORAL_TABLET | Freq: Every day | ORAL | 2 refills | Status: DC

## 2020-03-27 MED ORDER — SIMVASTATIN 20 MG PO TABS: 20.0000 mg | ORAL_TABLET | Freq: Every day | ORAL | 2 refills | Status: DC

## 2020-03-27 MED ORDER — LISINOPRIL 10 MG PO TABS: ORAL_TABLET | ORAL | 2 refills | Status: DC

## 2020-03-27 MED ORDER — OMEPRAZOLE 20 MG PO CPDR: 20.0000 mg | DELAYED_RELEASE_CAPSULE | Freq: Two times a day (BID) | ORAL | 3 refills | Status: DC

## 2020-03-27 NOTE — Progress Notes (Signed)
Patient ID: [name], male    DOB: 01-Nov-1965  Age: 54 y.o. MRN: [id number]  Chief Complaint  Patient presents with  . Umbilical Hernia    pt started noticing his bellybutton had started to protrude and became sore, pt notes he feels very bloated throughout abdomen, noted it went away for a day or two then came back, hurts with twisting sneezing and coughing,     Subjective:     Office note patient comes in complaining of a bulging in his umbilicus.  It bulges out for a few days, then it went back in.  It hurts when he sneezes or coughs.  His cousin did have a incarcerated umbilical hernia years ago which scares him a little bit.  He says is not bulging but it is sore today.  He has had bloating but the bowels have been moving well.  He has a lot of gas. Current allergies, medications, problem list, past/family and social histories reviewed.  Objective:  BP 132/88   Pulse 85   Temp 98.2 F (36.8 C) (Temporal)   Resp 16   Ht 5\' 9"  (1.753 m)   Wt 206 lb 12.8 oz (93.8 kg)   SpO2 96%   BMI 30.54 kg/m   No major acute distress.  Alert and oriented.  Abdomen has normal bowel sounds.  Soft without organomegaly or masses.  Is tender right in the umbilicus.  There is a tiny bulge when he standing up and coughs, but not bad and certainly not a palpable herniation. Assessment & Plan:   Assessment: 1. Umbilical pain   2. Barrett's esophagus without dysplasia   3. Generalized anxiety disorder   4. Essential hypertension   5. Mixed hyperlipidemia       Plan: See instructions.  Will do a CT scan or surgery referral if problems persist.  I think if he just keeps from bloating, avoids lifting, and gives a few days it should calm him down.  Labs were ordered.  Orders Placed This Encounter  Procedures  . CBC  . Comprehensive metabolic panel    Meds ordered this encounter  Medications  . simvastatin (ZOCOR) 20 MG tablet    Sig: Take 1 tablet (20 mg total) by mouth daily.     Dispense:  90 tablet    Refill:  2  . omeprazole (PRILOSEC) 20 MG capsule    Sig: Take 1 capsule (20 mg total) by mouth 2 (two) times daily before a meal.    Dispense:  180 capsule    Refill:  3  . buPROPion (WELLBUTRIN XL) 300 MG 24 hr tablet    Sig: Take 1 tablet (300 mg total) by mouth daily.    Dispense:  90 each    Refill:  2  . lisinopril (ZESTRIL) 10 MG tablet    Sig: Take 1 daily for blood pressure    Dispense:  90 tablet    Refill:  2         Patient Instructions     Your medicines were sent into the mail off pharmacy which is supposed to give significant savings.  Try to keep your bowels moving well and eat in a fashion that tries to avoid excessive abdominal bloating.  You can take a mild laxative like MiraLAX or Dulcolax if needed.  If your abdomen gets worse we will refer you to a surgeon for assessment or we will get a CT scan if needed.  In the event of acute worsening go to  the emergency room  Avoid excessive straining or lifting.  If you have lab work done today you will be contacted with your lab results within the next 2 weeks.  If you have not heard from [place] then please contact us. The fastest way to get your results is to register for My Chart.   IF you received an x-ray today, you will receive an invoice from [facility]. Please contact [facility] at [phone] with questions or concerns regarding your invoice.   IF you received labwork today, you will receive an invoice from [place]. Please contact [facility] at [phone] with questions or concerns regarding your invoice.   Our billing staff will not be able to assist you with questions regarding bills from these companies.  You will be contacted with the lab results as soon as they are available. The fastest way to get your results is to activate your My Chart account. Instructions are located on the last page of this paperwork. If you have not heard from [place] regarding the  results in 2 weeks, please contact this office.        Return if symptoms worsen or fail to improve.   Ruben Reason, MD 03/27/2020

## 2020-03-27 NOTE — Patient Instructions (Addendum)
   Your medicines were sent into the mail off pharmacy which is supposed to give significant savings.  Try to keep your bowels moving well and eat in a fashion that tries to avoid excessive abdominal bloating.  You can take a mild laxative like MiraLAX or Dulcolax if needed.  If your abdomen gets worse we will refer you to a surgeon for assessment or we will get a CT scan if needed.  In the event of acute worsening go to the emergency room  Avoid excessive straining or lifting.  If you have lab work done today you will be contacted with your lab results within the next 2 weeks.  If you have not heard from [place] then please contact us. The fastest way to get your results is to register for My Chart.   IF you received an x-ray today, you will receive an invoice from [facility]. Please contact [facility] at [phone] with questions or concerns regarding your invoice.   IF you received labwork today, you will receive an invoice from [place]. Please contact [facility] at [phone] with questions or concerns regarding your invoice.   Our billing staff will not be able to assist you with questions regarding bills from these companies.  You will be contacted with the lab results as soon as they are available. The fastest way to get your results is to activate your My Chart account. Instructions are located on the last page of this paperwork. If you have not heard from [place] regarding the results in 2 weeks, please contact this office.

## 2020-03-28 LAB — CBC
Hematocrit: 48.8 % (ref 37.5–51.0)
Hemoglobin: 15.9 g/dL (ref 13.0–17.7)
MCH: 29.4 pg (ref 26.6–33.0)
MCHC: 32.6 g/dL (ref 31.5–35.7)
MCV: 90 fL (ref 79–97)
Platelets: 270 10*3/uL (ref 150–450)
RBC: 5.41 x10E6/uL (ref 4.14–5.80)
RDW: 13.1 % (ref 11.6–15.4)
WBC: 8.2 10*3/uL (ref 3.4–10.8)

## 2020-03-28 LAB — COMPREHENSIVE METABOLIC PANEL
ALT: 18 IU/L (ref 0–44)
AST: 18 IU/L (ref 0–40)
Albumin/Globulin Ratio: 2.1 (ref 1.2–2.2)
Albumin: 4.8 g/dL (ref 3.8–4.9)
Alkaline Phosphatase: 124 IU/L — ABNORMAL HIGH (ref 48–121)
BUN/Creatinine Ratio: 15 (ref 9–20)
BUN: 12 mg/dL (ref 6–24)
Bilirubin Total: <0.2 mg/dL (ref 0.0–1.2)
CO2: 24 mmol/L (ref 20–29)
Calcium: 9.3 mg/dL (ref 8.7–10.2)
Chloride: 104 mmol/L (ref 96–106)
Creatinine, Ser: 0.82 mg/dL (ref 0.76–1.27)
GFR calc Af Amer: 117 mL/min/{1.73_m2} (ref >59)
GFR calc non Af Amer: 101 mL/min/{1.73_m2} (ref >59)
Globulin, Total: 2.3 g/dL (ref 1.5–4.5)
Glucose: 95 mg/dL (ref 65–99)
Potassium: 4.5 mmol/L (ref 3.5–5.2)
Sodium: 140 mmol/L (ref 134–144)
Total Protein: 7.1 g/dL (ref 6.0–8.5)

## 2020-03-28 NOTE — Progress Notes (Signed)
Call:  Labs look normal. Try to give the umbilical pain a little time and see if it resolves. If not improving over next couple of weeks we can refer on to general surgery for further evaluation.   Ruben Reason MD

## 2020-03-28 NOTE — Progress Notes (Signed)
Call:  Labs look normal.  Try to give the umbilical pain a little time and see if it resolves.  If not improving over next couple of weeks we can refer on to general surgery for further evaluation.  Ruben Reason MD

## 2020-05-24 ENCOUNTER — Emergency Department (HOSPITAL_BASED_OUTPATIENT_CLINIC_OR_DEPARTMENT_OTHER): Payer: BC Managed Care – PPO

## 2020-05-24 ENCOUNTER — Other Ambulatory Visit: Payer: Self-pay

## 2020-05-24 ENCOUNTER — Encounter (HOSPITAL_BASED_OUTPATIENT_CLINIC_OR_DEPARTMENT_OTHER): Payer: Self-pay | Admitting: Emergency Medicine

## 2020-05-24 DIAGNOSIS — Z79899 Other long term (current) drug therapy: Secondary | ICD-10-CM | POA: Diagnosis not present

## 2020-05-24 DIAGNOSIS — K760 Fatty (change of) liver, not elsewhere classified: Secondary | ICD-10-CM | POA: Diagnosis not present

## 2020-05-24 DIAGNOSIS — Z87891 Personal history of nicotine dependence: Secondary | ICD-10-CM | POA: Diagnosis not present

## 2020-05-24 DIAGNOSIS — I10 Essential (primary) hypertension: Secondary | ICD-10-CM | POA: Insufficient documentation

## 2020-05-24 DIAGNOSIS — R10811 Right upper quadrant abdominal tenderness: Secondary | ICD-10-CM | POA: Diagnosis not present

## 2020-05-24 DIAGNOSIS — R072 Precordial pain: Secondary | ICD-10-CM | POA: Insufficient documentation

## 2020-05-24 DIAGNOSIS — R1011 Right upper quadrant pain: Secondary | ICD-10-CM | POA: Insufficient documentation

## 2020-05-24 DIAGNOSIS — R0789 Other chest pain: Secondary | ICD-10-CM | POA: Diagnosis not present

## 2020-05-24 DIAGNOSIS — R079 Chest pain, unspecified: Secondary | ICD-10-CM | POA: Diagnosis not present

## 2020-05-24 LAB — BASIC METABOLIC PANEL
Anion gap: 10 (ref 5–15)
BUN: 12 mg/dL (ref 6–20)
CO2: 26 mmol/L (ref 22–32)
Calcium: 9.1 mg/dL (ref 8.9–10.3)
Chloride: 101 mmol/L (ref 98–111)
Creatinine, Ser: 0.75 mg/dL (ref 0.61–1.24)
GFR calc Af Amer: >60 mL/min (ref >60)
GFR calc non Af Amer: >60 mL/min (ref >60)
Glucose, Bld: 91 mg/dL (ref 70–99)
Potassium: 4.3 mmol/L (ref 3.5–5.1)
Sodium: 137 mmol/L (ref 135–145)

## 2020-05-24 LAB — CBC
HCT: 49 % (ref 39.0–52.0)
Hemoglobin: 16.2 g/dL (ref 13.0–17.0)
MCH: 29.6 pg (ref 26.0–34.0)
MCHC: 33.1 g/dL (ref 30.0–36.0)
MCV: 89.6 fL (ref 80.0–100.0)
Platelets: 278 10*3/uL (ref 150–400)
RBC: 5.47 MIL/uL (ref 4.22–5.81)
RDW: 13.4 % (ref 11.5–15.5)
WBC: 12.5 10*3/uL — ABNORMAL HIGH (ref 4.0–10.5)
nRBC: 0 % (ref 0.0–0.2)

## 2020-05-24 LAB — TROPONIN I (HIGH SENSITIVITY): Troponin I (High Sensitivity): 2 ng/L (ref <18)

## 2020-05-24 NOTE — ED Triage Notes (Signed)
Intermittent chest pain since yesterday. Sent from [facility].

## 2020-05-25 ENCOUNTER — Emergency Department (HOSPITAL_BASED_OUTPATIENT_CLINIC_OR_DEPARTMENT_OTHER): Payer: BC Managed Care – PPO

## 2020-05-25 ENCOUNTER — Emergency Department (HOSPITAL_BASED_OUTPATIENT_CLINIC_OR_DEPARTMENT_OTHER)
Admission: EM | Admit: 2020-05-25 | Discharge: 2020-05-25 | Disposition: A | Discharge status: Home / Self Care | Payer: BC Managed Care – PPO | Attending: Emergency Medicine | Admitting: Emergency Medicine

## 2020-05-25 DIAGNOSIS — R079 Chest pain, unspecified: Secondary | ICD-10-CM

## 2020-05-25 DIAGNOSIS — K760 Fatty (change of) liver, not elsewhere classified: Secondary | ICD-10-CM | POA: Diagnosis not present

## 2020-05-25 DIAGNOSIS — R1011 Right upper quadrant pain: Secondary | ICD-10-CM

## 2020-05-25 LAB — HEPATIC FUNCTION PANEL
ALT: 21 U/L (ref 0–44)
AST: 21 U/L (ref 15–41)
Albumin: 4.1 g/dL (ref 3.5–5.0)
Alkaline Phosphatase: 92 U/L (ref 38–126)
Bilirubin, Direct: 0.1 mg/dL (ref 0.0–0.2)
Indirect Bilirubin: 0.3 mg/dL (ref 0.3–0.9)
Total Bilirubin: 0.4 mg/dL (ref 0.3–1.2)
Total Protein: 7 g/dL (ref 6.5–8.1)

## 2020-05-25 LAB — TROPONIN I (HIGH SENSITIVITY): Troponin I (High Sensitivity): 2 ng/L (ref <18)

## 2020-05-25 LAB — LIPASE, BLOOD: Lipase: 37 U/L (ref 11–51)

## 2020-05-25 MED ORDER — LIDOCAINE VISCOUS HCL 2 % MT SOLN
15.0000 mL | Freq: Once | OROMUCOSAL | Status: AC
  Administered 2020-05-25: 15 mL via ORAL
  Filled 2020-05-25: qty 15

## 2020-05-25 MED ORDER — ALUM & MAG HYDROXIDE-SIMETH 200-200-20 MG/5ML PO SUSP
30.0000 mL | Freq: Once | ORAL | Status: AC
  Administered 2020-05-25: 30 mL via ORAL
  Filled 2020-05-25: qty 30

## 2020-05-25 NOTE — ED Notes (Signed)
Pt tolerated food and drink w/o difficulty

## 2020-05-25 NOTE — ED Provider Notes (Signed)
[facility] EMERGENCY DEPARTMENT Provider Note   CSN: [id number] Arrival date & time: 05/24/20  1709     History Chief Complaint  Patient presents with  . Chest Pain    [name] is a 54 y.o. male.  The history is provided by the patient and medical records. No language interpreter was used.  Chest Pain Pain location:  Substernal area and R chest Pain quality: aching, dull, pressure and sharp   Pain radiates to:  Epigastrium Pain severity:  Moderate Onset quality:  Gradual Duration:  2 days Timing:  Constant Progression:  Waxing and waning Chronicity:  New Relieved by:  Nothing Worsened by:  Nothing Associated symptoms: abdominal pain, fatigue and nausea   Associated symptoms: no altered mental status, no anxiety, no back pain, no claudication, no cough, no diaphoresis, no dizziness, no fever, no headache, no lower extremity edema, no near-syncope, no numbness, no palpitations, no shortness of breath and no vomiting   Risk factors: hypertension and male sex   Risk factors: no prior DVT/PE        Past Medical History:  Diagnosis Date  . Allergy    seasonal  . Anxiety   . Barrett's esophagus   . Depression   . GERD (gastroesophageal reflux disease) 12/2007  . HLD (hyperlipidemia)   . Hypertension   . OSA on CPAP   . Sleep apnea    Bipap    Patient Active Problem List   Diagnosis Date Noted  . Skin lesion of face 05/11/2018  . History of sleep apnea 05/11/2018  . Generalized anxiety disorder 05/11/2018  . Sleep disturbance 05/11/2018  . History of Barrett's esophagus 05/11/2018  . Abnormal EKG 11/24/2016  . Smoker 11/24/2016  . Family history of coronary artery disease 11/24/2016  . Hyperlipemia 08/09/2015  . Gastroesophageal reflux disease without esophagitis 08/09/2015  . Essential hypertension 08/09/2015  . HTN (hypertension) 08/22/2012  . Anxiety 08/22/2012  . Barrett's esophagus without dysplasia 08/22/2012  . OSA on CPAP     Past  Surgical History:  Procedure Laterality Date  . NASAL SINUS SURGERY    . REFRACTIVE SURGERY     bilateral  . UPPER GASTROINTESTINAL ENDOSCOPY    . UVULECTOMY         Family History  Problem Relation Age of Onset  . Glaucoma Mother   . Cataracts Mother        complicated surgery  . Prostate cancer Father        metastatic  . Hypertension Father   . Alcohol abuse Father   . Heart disease Father   . Diabetes Father   . Cirrhosis Father   . Gout Father   . Arthritis Father   . Anxiety disorder Sister   . Hypertension Sister   . Glaucoma Paternal Uncle   . Heart disease Paternal Uncle   . Alcohol abuse Paternal Uncle   . Colon cancer Paternal Uncle   . Heart disease Paternal Uncle   . Multiple sclerosis Maternal Aunt   . Brain cancer Maternal Uncle   . Alcohol abuse Maternal Uncle     Social History   Tobacco Use  . Smoking status: Former Smoker    Types: Cigarettes    Quit date: 01/19/2007    Years since quitting: 13.3  . Smokeless tobacco: Never Used  Substance Use Topics  . Alcohol use: Yes    Alcohol/week: 0.0 - 5.0 standard drinks    Comment: usually once a month  . Drug use: No  Home Medications Prior to Admission medications   Medication Sig Start Date End Date Taking? Authorizing Provider  ALPRAZolam Duanne Moron) 1 MG tablet TAKE 1/2 A TABLET BY MOUTH DAILY AS NEEDED FOR ANXIETY 06/21/19   Posey Boyer, MD  aspirin EC 81 MG tablet Take 81 mg by mouth daily.    [provider]  buPROPion (WELLBUTRIN XL) 300 MG 24 hr tablet Take 1 tablet (300 mg total) by mouth daily. 03/27/20   Posey Boyer, MD  lisinopril (ZESTRIL) 10 MG tablet Take 1 daily for blood pressure 03/27/20   Posey Boyer, MD  Multiple Vitamin (MULTIVITAMIN) tablet Take 1 tablet by mouth daily.    [provider]  Na Sulfate-K Sulfate-Mg Sulf 17.5-3.13-1.6 GM/180ML SOLN Suprep as directed.  No substitutions. 01/14/17   Ladene Artist, MD  niacin 500 MG tablet Take 1  tablet (500 mg total) by mouth daily with breakfast. 05/11/18   Horald Pollen, MD  Omega-3 Fatty Acids (FISH OIL) 500 MG CAPS Take by mouth daily.    [provider]  omeprazole (PRILOSEC) 20 MG capsule Take 1 capsule (20 mg total) by mouth 2 (two) times daily before a meal. 03/27/20   Posey Boyer, MD  simvastatin (ZOCOR) 20 MG tablet Take 1 tablet (20 mg total) by mouth daily. 03/27/20   Posey Boyer, MD  zolpidem (AMBIEN) 5 MG tablet Take 1 tablet (5 mg total) by mouth at bedtime as needed. 06/21/19   Posey Boyer, MD    Allergies    Thimerosal, Ampicillin, and Iodine  Review of Systems   Review of Systems  Constitutional: Positive for fatigue. Negative for chills, diaphoresis and fever.  HENT: Negative for congestion.   Eyes: Negative for visual disturbance.  Respiratory: Negative for cough, choking, chest tightness, shortness of breath and wheezing.   Cardiovascular: Positive for chest pain. Negative for palpitations, claudication, leg swelling and near-syncope.  Gastrointestinal: Positive for abdominal pain and nausea. Negative for constipation, diarrhea and vomiting.  Genitourinary: Negative for dysuria and flank pain.  Musculoskeletal: Negative for back pain, neck pain and neck stiffness.  Skin: Negative for rash and wound.  Neurological: Negative for dizziness, light-headedness, numbness and headaches.  Psychiatric/Behavioral: Negative for agitation and confusion.  All other systems reviewed and are negative.   Physical Exam Updated Vital Signs BP (!) 146/105 (BP Location: Left Arm)   Pulse 73   Temp 98.2 F (36.8 C) (Oral)   Resp 16   Ht 5\' 9"  (1.753 m)   Wt 92.1 kg   SpO2 100%   BMI 29.98 kg/m   Physical Exam Vitals and nursing note reviewed.  Constitutional:      General: He is not in acute distress.    Appearance: He is well-developed. He is not ill-appearing, toxic-appearing or diaphoretic.  HENT:     Head: Normocephalic and atraumatic.    Eyes:     Conjunctiva/sclera: Conjunctivae normal.     Pupils: Pupils are equal, round, and reactive to light.  Cardiovascular:     Rate and Rhythm: Normal rate and regular rhythm.     Heart sounds: Normal heart sounds. No murmur heard.   Pulmonary:     Effort: Pulmonary effort is normal. No respiratory distress.     Breath sounds: Normal breath sounds. No decreased breath sounds, wheezing, rhonchi or rales.  Chest:     Chest wall: No tenderness.  Abdominal:     Palpations: Abdomen is soft.     Tenderness: There  is abdominal tenderness in the right upper quadrant and epigastric area.    Musculoskeletal:     Cervical back: Neck supple.  Skin:    General: Skin is warm and dry.     Capillary Refill: Capillary refill takes less than 2 seconds.  Neurological:     General: No focal deficit present.     Mental Status: He is alert.  Psychiatric:        Mood and Affect: Mood normal.     ED Results / Procedures / Treatments   Labs (all labs ordered are listed, but only abnormal results are displayed) Labs Reviewed  CBC - Abnormal; Notable for the following components:      Result Value   WBC 12.5 (*)    All other components within normal limits  BASIC METABOLIC PANEL  HEPATIC FUNCTION PANEL  LIPASE, BLOOD  TROPONIN I (HIGH SENSITIVITY)  TROPONIN I (HIGH SENSITIVITY)    EKG EKG Interpretation  Date/Time:  Saturday May 24 2020 17:19:33 EDT Ventricular Rate:  72 PR Interval:  160 QRS Duration: 86 QT Interval:  396 QTC Calculation: 433 R Axis:   44 Text Interpretation: Normal sinus rhythm Normal ECG When compared to prior, similar apperance. No STEMI Confirmed by Antony Blackbird [phone]) on 05/25/2020 7:12:33 AM   Radiology US Abdomen Limited RUQ  Result Date: 05/25/2020 CLINICAL DATA:  Epigastric pain EXAM: ULTRASOUND ABDOMEN LIMITED RIGHT UPPER QUADRANT COMPARISON:  None. FINDINGS: Gallbladder: No gallstones or wall thickening visualized. No sonographic Murphy sign  noted by sonographer. Common bile duct: Diameter: 2 mm Liver: Echogenic liver. No focal lesion is seen. Portal vein is patent on color Doppler imaging with normal direction of blood flow towards the liver. IMPRESSION: 1. Negative gallbladder. 2. Hepatic steatosis. Electronically Signed   By: Monte Fantasia M.D.   On: 05/25/2020 10:21    Procedures Procedures (including critical care time)  Medications Ordered in ED Medications  alum & mag hydroxide-simeth (MAALOX/MYLANTA) 200-200-20 MG/5ML suspension 30 mL (30 mLs Oral Given 05/25/20 0802)    And  lidocaine (XYLOCAINE) 2 % viscous mouth solution 15 mL (15 mLs Oral Given 05/25/20 6045)    ED Course  I have reviewed the triage vital signs and the nursing notes.  Pertinent labs & imaging results that were available during my care of the patient were reviewed by me and considered in my medical decision making (see chart for details).    MDM Rules/Calculators/A&P                          Oskar Cretella is a 53 y.o. male with a past medical history significant for hypertension, hyperlipidemia, anxiety, Barrett's esophagus, and GERD who presents after visiting an urgent care for chest pain and abdominal pain.  Patient reports that yesterday morning, he started having pain in his central right lower chest that felt like aching and occasional sharpness.  He reports it is not exertional or pleuritic and he denies any history of DVT or PE.  He reports no shortness of breath but did have some nausea at one point.  He denied diaphoresis related to this.  He denies any recent trauma.  He reports that he has never had this type of discomfort before and does not feel exactly like his Barrett's esophagus and reflux troubles he has had in the past.  He denies any leg pain or leg swelling.  Denies any lower abdominal pain but does report the pain is in his  epigastric and right upper quadrant as well.  He denies any history of gallbladder pathology in the past.  He  went to urgent care where they told him he needed to come to the emergency department for further evaluation after getting reassuring chest x-ray.  On exam, lungs are clear and chest is nontender.  No murmur.  Right upper quadrant is tender to palpation and bowel sounds were appreciated.  He denies any constipation or diarrhea or urinary symptoms.  Flanks and back nontender.  Patient resting comfortably with no hypoxia or tachycardia or fever.  Based on exam and description of symptoms I am somewhat concerned about a cholecystitis as the cause of symptoms versus a more GI related reflux cause.  As he had a reassuring chest x-ray yesterday, we agreed to hold on further x-ray imaging.  We will get a right upper quadrant ultrasound as well as add a hepatic function and lipase to the already completed labs.  We will also get a second troponin to make sure it is not rising.  He will get a GI cocktail.  If work-up is reassuring, anticipate discharge home with plans to follow-up with GI and PCP and possibly cardiology.  Patient is in agreement with this plan.  3:05 PM Patient's work-up was overall reassuring.  Hepatic function lipase not elevated.  Right upper ultrasound was reassuring aside hepatic status.  No evidence of gallbladder disease.  Patient reports his pain completely relieved after GI cocktail I suspect this is more reflux related.  Patient agrees with plan of care of discharge and follow-up with his gastroenterologist and PCP.  He reports he is overdue for an endoscopy.  Vital signs are reassuring and patient is feeling better.  He will be discharged home for outpatient PCP follow-up.  He understood return precautions and was discharged in good condition.   Final Clinical Impression(s) / ED Diagnoses Final diagnoses:  RUQ abdominal pain    Rx / DC Orders ED Discharge Orders    None      Clinical Impression: 1. Chest pain, unspecified type   2. RUQ abdominal pain      Disposition: Discharge  Condition: Good  I have discussed the results, Dx and Tx plan with the pt(& family if present). He/she/they expressed understanding and agree(s) with the plan. Discharge instructions discussed at great length. Strict return precautions discussed and pt &/or family have verbalized understanding of the instructions. No further questions at time of discharge.    New Prescriptions   No medications on file    Follow Up: [facility] [place] [id number] [phone]    Horald Pollen, MD [place] [id number] [phone]     [facility] [address] [id number] [facility] [place]       Royanne Warshaw, Gwenyth Allegra, MD 05/25/20 [phone]

## 2020-05-25 NOTE — ED Notes (Signed)
Pt amb to [facility]

## 2020-05-25 NOTE — ED Notes (Signed)
Provided graham crackers and Coke for po challenge

## 2020-05-25 NOTE — Discharge Instructions (Addendum)
Your work-up today was overall reassuring and her right upper quad ultrasound not show evidence of gallbladder disease.  We did see some fatty liver as we discussed so please follow-up with your PCP and your gastroenterologist.  I suspect her symptoms are related to your GI tract and esophagus and stomach as we discussed.  Please continue your Prilosec and avoid spicy foods.  Please stay hydrated and rest.  If any symptoms change or worsen, please return to the nearest emergency department.

## 2020-09-05 ENCOUNTER — Encounter: Payer: Self-pay | Admitting: Family Medicine

## 2020-09-18 DIAGNOSIS — Z20822 Contact with and (suspected) exposure to covid-19: Secondary | ICD-10-CM | POA: Diagnosis not present

## 2020-11-21 ENCOUNTER — Other Ambulatory Visit: Payer: Self-pay | Admitting: Family Medicine

## 2020-11-21 DIAGNOSIS — F411 Generalized anxiety disorder: Secondary | ICD-10-CM

## 2020-11-21 NOTE — Telephone Encounter (Signed)
Courtesy refill. Overdue office visit. Attempted to call patient and schedule appt. No answer, left message to call clinic to schedule appt .

## 2020-12-27 ENCOUNTER — Other Ambulatory Visit: Payer: Self-pay | Admitting: Family Medicine

## 2020-12-27 DIAGNOSIS — F411 Generalized anxiety disorder: Secondary | ICD-10-CM

## 2020-12-27 DIAGNOSIS — I10 Essential (primary) hypertension: Secondary | ICD-10-CM

## 2020-12-27 NOTE — Telephone Encounter (Signed)
Refusing-requested pharm to route to [facility].

## 2020-12-27 NOTE — Telephone Encounter (Signed)
Requested pharmacy to route to [facility] where Dr. Mitchel Honour is currently practicing.

## 2020-12-31 ENCOUNTER — Other Ambulatory Visit: Payer: Self-pay | Admitting: Family Medicine

## 2020-12-31 DIAGNOSIS — E782 Mixed hyperlipidemia: Secondary | ICD-10-CM

## 2020-12-31 NOTE — Telephone Encounter (Signed)
Requested medications are due for refill today.  yes  Requested medications are on the active medications list.  yes  Last refill. 03/27/2020  Future visit scheduled.   no  Notes to clinic.

## 2021-01-26 NOTE — Telephone Encounter (Signed)
Please Advise

## 2021-01-30 ENCOUNTER — Telehealth: Payer: Self-pay | Admitting: *Deleted

## 2021-01-30 NOTE — Telephone Encounter (Signed)
Left voice mail he needs to schedule an appointment for refills on simvatatin. Medication denied.  Last office visit with Dr Mitchel Honour was 12/11/2019.

## 2021-06-15 ENCOUNTER — Other Ambulatory Visit: Payer: Self-pay

## 2021-06-15 ENCOUNTER — Ambulatory Visit (INDEPENDENT_AMBULATORY_CARE_PROVIDER_SITE_OTHER): Payer: 59 | Admitting: Emergency Medicine

## 2021-06-15 ENCOUNTER — Encounter: Payer: Self-pay | Admitting: Emergency Medicine

## 2021-06-15 VITALS — BP 132/82 | HR 82 | Temp 98.3°F | Ht 69.0 in | Wt 218.0 lb

## 2021-06-15 DIAGNOSIS — Z1211 Encounter for screening for malignant neoplasm of colon: Secondary | ICD-10-CM

## 2021-06-15 DIAGNOSIS — Z125 Encounter for screening for malignant neoplasm of prostate: Secondary | ICD-10-CM | POA: Diagnosis not present

## 2021-06-15 DIAGNOSIS — Z800 Family history of malignant neoplasm of digestive organs: Secondary | ICD-10-CM

## 2021-06-15 DIAGNOSIS — K429 Umbilical hernia without obstruction or gangrene: Secondary | ICD-10-CM

## 2021-06-15 DIAGNOSIS — F411 Generalized anxiety disorder: Secondary | ICD-10-CM | POA: Diagnosis not present

## 2021-06-15 DIAGNOSIS — Z13228 Encounter for screening for other metabolic disorders: Secondary | ICD-10-CM

## 2021-06-15 DIAGNOSIS — Z0001 Encounter for general adult medical examination with abnormal findings: Secondary | ICD-10-CM | POA: Diagnosis not present

## 2021-06-15 DIAGNOSIS — Z130 Encounter for screening for diseases of the blood and blood-forming organs and certain disorders involving the immune mechanism: Secondary | ICD-10-CM

## 2021-06-15 DIAGNOSIS — Z23 Encounter for immunization: Secondary | ICD-10-CM

## 2021-06-15 DIAGNOSIS — I10 Essential (primary) hypertension: Secondary | ICD-10-CM | POA: Diagnosis not present

## 2021-06-15 DIAGNOSIS — E782 Mixed hyperlipidemia: Secondary | ICD-10-CM | POA: Diagnosis not present

## 2021-06-15 DIAGNOSIS — G4733 Obstructive sleep apnea (adult) (pediatric): Secondary | ICD-10-CM

## 2021-06-15 DIAGNOSIS — Z9989 Dependence on other enabling machines and devices: Secondary | ICD-10-CM

## 2021-06-15 DIAGNOSIS — Z1329 Encounter for screening for other suspected endocrine disorder: Secondary | ICD-10-CM

## 2021-06-15 LAB — LIPID PANEL
Cholesterol: 164 mg/dL (ref 0–200)
HDL: 42.5 mg/dL (ref >39.00)
LDL Cholesterol: 101 mg/dL — ABNORMAL HIGH (ref 0–99)
NonHDL: 121.94
Total CHOL/HDL Ratio: 4
Triglycerides: 104 mg/dL (ref 0.0–149.0)
VLDL: 20.8 mg/dL (ref 0.0–40.0)

## 2021-06-15 LAB — CBC WITH DIFFERENTIAL/PLATELET
Basophils Absolute: 0 10*3/uL (ref 0.0–0.1)
Basophils Relative: 0.6 % (ref 0.0–3.0)
Eosinophils Absolute: 0.1 10*3/uL (ref 0.0–0.7)
Eosinophils Relative: 0.7 % (ref 0.0–5.0)
HCT: 45.8 % (ref 39.0–52.0)
Hemoglobin: 15.3 g/dL (ref 13.0–17.0)
Lymphocytes Relative: 20.8 % (ref 12.0–46.0)
Lymphs Abs: 1.6 10*3/uL (ref 0.7–4.0)
MCHC: 33.4 g/dL (ref 30.0–36.0)
MCV: 88.5 fl (ref 78.0–100.0)
Monocytes Absolute: 0.7 10*3/uL (ref 0.1–1.0)
Monocytes Relative: 9.4 % (ref 3.0–12.0)
Neutro Abs: 5.1 10*3/uL (ref 1.4–7.7)
Neutrophils Relative %: 68.5 % (ref 43.0–77.0)
Platelets: 248 10*3/uL (ref 150.0–400.0)
RBC: 5.18 Mil/uL (ref 4.22–5.81)
RDW: 13.9 % (ref 11.5–15.5)
WBC: 7.5 10*3/uL (ref 4.0–10.5)

## 2021-06-15 LAB — COMPREHENSIVE METABOLIC PANEL
ALT: 26 U/L (ref 0–53)
AST: 17 U/L (ref 0–37)
Albumin: 4.3 g/dL (ref 3.5–5.2)
Alkaline Phosphatase: 98 U/L (ref 39–117)
BUN: 11 mg/dL (ref 6–23)
CO2: 26 mEq/L (ref 19–32)
Calcium: 9.1 mg/dL (ref 8.4–10.5)
Chloride: 104 mEq/L (ref 96–112)
Creatinine, Ser: 0.81 mg/dL (ref 0.40–1.50)
GFR: 99.51 mL/min (ref >60.00)
Glucose, Bld: 107 mg/dL — ABNORMAL HIGH (ref 70–99)
Potassium: 4.1 mEq/L (ref 3.5–5.1)
Sodium: 139 mEq/L (ref 135–145)
Total Bilirubin: 0.5 mg/dL (ref 0.2–1.2)
Total Protein: 6.7 g/dL (ref 6.0–8.3)

## 2021-06-15 LAB — PSA: PSA: 1.78 ng/mL (ref 0.10–4.00)

## 2021-06-15 LAB — HEMOGLOBIN A1C: Hgb A1c MFr Bld: 5.8 % (ref 4.6–6.5)

## 2021-06-15 MED ORDER — ALPRAZOLAM 0.5 MG PO TABS: 0.5000 mg | ORAL_TABLET | Freq: Every day | ORAL | 1 refills | Status: DC | PRN

## 2021-06-15 MED ORDER — LISINOPRIL 10 MG PO TABS: ORAL_TABLET | ORAL | 3 refills | Status: DC

## 2021-06-15 MED ORDER — SIMVASTATIN 20 MG PO TABS: 20.0000 mg | ORAL_TABLET | Freq: Every day | ORAL | 2 refills | Status: DC

## 2021-06-15 MED ORDER — BUPROPION HCL ER (XL) 300 MG PO TB24: 300.0000 mg | ORAL_TABLET | Freq: Every day | ORAL | 3 refills | Status: DC

## 2021-06-15 NOTE — Progress Notes (Signed)
[facility] 55 y.o.   Chief Complaint  Patient presents with   Annual Exam    Medication refill on wellbuttrin, lisinopril, xanax, and ambien zocor, prilosec    HISTORY OF PRESENT ILLNESS: This is a 55 y.o. male here for annual exam. Has the following chronic medical problems: 1.  Hypertension: On lisinopril 10 mg daily.  Normal blood pressure readings at home 2.  Dyslipidemia: On simvastatin 20 mg daily. 3.  History of sleep apnea 4.  History of Barrett's esophagus 5.  History of colon cancer in the father.  Needs referral for colonoscopy 6.  Umbilical hernia: Needs referral for general surgeon 7.  Generalized anxiety disorder: On Wellbutrin for 6 years.  Takes Xanax maybe once or twice a month. No other complaints or medical concerns today.  HPI   Prior to Admission medications   Medication Sig Start Date End Date Taking? Authorizing Provider  ALPRAZolam Duanne Moron) 1 MG tablet TAKE 1/2 A TABLET BY MOUTH DAILY AS NEEDED FOR ANXIETY 06/21/19  Yes Posey Boyer, MD  aspirin EC 81 MG tablet Take 81 mg by mouth daily.   Yes [provider]  buPROPion (WELLBUTRIN XL) 300 MG 24 hr tablet TAKE 1 TABLET BY MOUTH EVERY DAY 11/21/20  Yes Posey Boyer, MD  lisinopril (ZESTRIL) 10 MG tablet Take 1 daily for blood pressure 03/27/20  Yes Posey Boyer, MD  Multiple Vitamin (MULTIVITAMIN) tablet Take 1 tablet by mouth daily.   Yes [provider]  niacin 500 MG tablet Take 1 tablet (500 mg total) by mouth daily with breakfast. 05/11/18  Yes Janari Yamada, Ines Bloomer, MD  Omega-3 Fatty Acids (FISH OIL) 500 MG CAPS Take by mouth daily.   Yes [provider]  omeprazole (PRILOSEC) 20 MG capsule Take 1 capsule (20 mg total) by mouth 2 (two) times daily before a meal. 03/27/20  Yes Posey Boyer, MD  simvastatin (ZOCOR) 20 MG tablet Take 1 tablet (20 mg total) by mouth daily. 03/27/20  Yes Posey Boyer, MD  zolpidem (AMBIEN) 5 MG tablet Take 1 tablet (5 mg total) by mouth  at bedtime as needed. 06/21/19  Yes Posey Boyer, MD  Na Sulfate-K Sulfate-Mg Sulf 17.5-3.13-1.6 GM/180ML SOLN Suprep as directed.  No substitutions. Patient not taking: Reported on 06/15/2021 01/14/17   Ladene Artist, MD    Allergies  Allergen Reactions   Thimerosal Swelling   Ampicillin Rash    Childhood.   Iodine Rash    Child hood.    Patient Active Problem List   Diagnosis Date Noted   History of sleep apnea 05/11/2018   Generalized anxiety disorder 05/11/2018   Sleep disturbance 05/11/2018   History of Barrett's esophagus 05/11/2018   Abnormal EKG 11/24/2016   Smoker 11/24/2016   Family history of coronary artery disease 11/24/2016   Hyperlipemia 08/09/2015   Gastroesophageal reflux disease without esophagitis 08/09/2015   Essential hypertension 08/09/2015   HTN (hypertension) 08/22/2012   Anxiety 08/22/2012   Barrett's esophagus without dysplasia 08/22/2012   OSA on CPAP     Past Medical History:  Diagnosis Date   Allergy    seasonal   Anxiety    Barrett's esophagus    Depression    GERD (gastroesophageal reflux disease) 12/2007   HLD (hyperlipidemia)    Hypertension    OSA on CPAP    Sleep apnea    Bipap    Past Surgical History:  Procedure Laterality Date   NASAL SINUS SURGERY  REFRACTIVE SURGERY     bilateral   UPPER GASTROINTESTINAL ENDOSCOPY     UVULECTOMY      Social History   Socioeconomic History   Marital status: Married    Spouse name: Sherrie Mustache   Number of children: 2   Years of education: 16   Highest education level: Not on file  Occupational History   Occupation: Scientist, research (physical sciences)    Employer: [employer]  Tobacco Use   Smoking status: Former    Types: Cigarettes    Quit date: 01/19/2007    Years since quitting: 14.4   Smokeless tobacco: Never  Substance and Sexual Activity   Alcohol use: Yes    Alcohol/week: 0.0 - 5.0 standard drinks    Comment: usually once a month   Drug use: No   Sexual  activity: Yes    Partners: Female  Other Topics Concern   Not on file  Social History Narrative   Lives with wife (travelling nurse, gone about half the time) from whom he is separated, but on good terms.  Shares custody of his daughters with his first wife.  His current wife has two sons, one grown, the other lives with his father.   Social Determinants of Health   Financial Resource Strain: Not on file  Food Insecurity: Not on file  Transportation Needs: Not on file  Physical Activity: Not on file  Stress: Not on file  Social Connections: Not on file  Intimate Partner Violence: Not on file    Family History  Problem Relation Age of Onset   Glaucoma Mother    Cataracts Mother        complicated surgery   Prostate cancer Father        metastatic   Hypertension Father    Alcohol abuse Father    Heart disease Father    Diabetes Father    Cirrhosis Father    Gout Father    Arthritis Father    Anxiety disorder Sister    Hypertension Sister    Glaucoma Paternal Uncle    Heart disease Paternal Uncle    Alcohol abuse Paternal Uncle    Colon cancer Paternal Uncle    Heart disease Paternal Uncle    Multiple sclerosis Maternal Aunt    Brain cancer Maternal Uncle    Alcohol abuse Maternal Uncle      Review of Systems  Constitutional: Negative.  Negative for chills and fever.  HENT: Negative.  Negative for congestion and sore throat.   Eyes:        Teary left eye mostly in the mornings  Respiratory: Negative.  Negative for cough and shortness of breath.   Cardiovascular: Negative.  Negative for chest pain and palpitations.  Gastrointestinal:  Negative for abdominal pain, diarrhea, nausea and vomiting.       Umbilical hernia  Genitourinary: Negative.  Negative for dysuria.  Musculoskeletal: Negative.   Skin: Negative.  Negative for rash.  Neurological:  Negative for dizziness and headaches.  All other systems reviewed and are negative.  Today's Vitals   06/15/21 0801   BP: 132/82  Pulse: 82  Temp: 98.3 F (36.8 C)  TempSrc: Oral  SpO2: 95%  Weight: 218 lb (98.9 kg)  Height: '5\' 9"'$  (1.753 m)   Body mass index is 32.19 kg/m.  Physical Exam Vitals reviewed.  Constitutional:      Appearance: Normal appearance.  HENT:     Head: Normocephalic.     Right Ear: Tympanic membrane, ear canal  and external ear normal.     Left Ear: Ear canal and external ear normal.     Mouth/Throat:     Mouth: Mucous membranes are moist.     Pharynx: Oropharynx is clear.  Eyes:     Extraocular Movements: Extraocular movements intact.     Conjunctiva/sclera: Conjunctivae normal.     Pupils: Pupils are equal, round, and reactive to light.  Neck:     Vascular: No carotid bruit.  Cardiovascular:     Rate and Rhythm: Normal rate and regular rhythm.  Pulmonary:     Effort: Pulmonary effort is normal.     Breath sounds: Normal breath sounds.  Abdominal:     General: There is no distension.     Palpations: Abdomen is soft.     Tenderness: There is no abdominal tenderness.     Hernia: A hernia (Umbilical hernia) is present.  Musculoskeletal:        General: Normal range of motion.     Cervical back: Normal range of motion and neck supple. No tenderness.  Lymphadenopathy:     Cervical: No cervical adenopathy.  Skin:    General: Skin is warm and dry.     Capillary Refill: Capillary refill takes less than 2 seconds.  Neurological:     General: No focal deficit present.     Mental Status: He is alert and oriented to person, place, and time.  Psychiatric:        Mood and Affect: Mood normal.        Behavior: Behavior normal.   Results for orders placed or performed in visit on 06/15/21 (from the past 24 hour(s))  CBC with Differential     Status: None   Collection Time: 06/15/21  8:50 AM  Result Value Ref Range   WBC 7.5 4.0 - 10.5 K/uL   RBC 5.18 4.22 - 5.81 Mil/uL   Hemoglobin 15.3 13.0 - 17.0 g/dL   HCT 45.8 39.0 - 52.0 %   MCV 88.5 78.0 - 100.0 fl   MCHC  33.4 30.0 - 36.0 g/dL   RDW 13.9 11.5 - 15.5 %   Platelets 248.0 150.0 - 400.0 K/uL   Neutrophils Relative % 68.5 43.0 - 77.0 %   Lymphocytes Relative 20.8 12.0 - 46.0 %   Monocytes Relative 9.4 3.0 - 12.0 %   Eosinophils Relative 0.7 0.0 - 5.0 %   Basophils Relative 0.6 0.0 - 3.0 %   Neutro Abs 5.1 1.4 - 7.7 K/uL   Lymphs Abs 1.6 0.7 - 4.0 K/uL   Monocytes Absolute 0.7 0.1 - 1.0 K/uL   Eosinophils Absolute 0.1 0.0 - 0.7 K/uL   Basophils Absolute 0.0 0.0 - 0.1 K/uL  Comprehensive metabolic panel     Status: Abnormal   Collection Time: 06/15/21  8:50 AM  Result Value Ref Range   Sodium 139 135 - 145 mEq/L   Potassium 4.1 3.5 - 5.1 mEq/L   Chloride 104 96 - 112 mEq/L   CO2 26 19 - 32 mEq/L   Glucose, Bld 107 (H) 70 - 99 mg/dL   BUN 11 6 - 23 mg/dL   Creatinine, Ser 0.81 0.40 - 1.50 mg/dL   Total Bilirubin 0.5 0.2 - 1.2 mg/dL   Alkaline Phosphatase 98 39 - 117 U/L   AST 17 0 - 37 U/L   ALT 26 0 - 53 U/L   Total Protein 6.7 6.0 - 8.3 g/dL   Albumin 4.3 3.5 - 5.2 g/dL   GFR 99.51 >  60.00 mL/min   Calcium 9.1 8.4 - 10.5 mg/dL  Hemoglobin A1c     Status: None   Collection Time: 06/15/21  8:50 AM  Result Value Ref Range   Hgb A1c MFr Bld 5.8 4.6 - 6.5 %  Lipid panel     Status: Abnormal   Collection Time: 06/15/21  8:50 AM  Result Value Ref Range   Cholesterol 164 0 - 200 mg/dL   Triglycerides 104.0 0.0 - 149.0 mg/dL   HDL 42.50 >39.00 mg/dL   VLDL 20.8 0.0 - 40.0 mg/dL   LDL Cholesterol 101 (H) 0 - 99 mg/dL   Total CHOL/HDL Ratio 4    NonHDL 121.94   PSA(Must document that pt has been informed of limitations of PSA testing.)     Status: None   Collection Time: 06/15/21  8:50 AM  Result Value Ref Range   PSA 1.78 0.10 - 4.00 ng/mL   The 10-year ASCVD risk score (Arnett DK, et al., 2019) is: 11.7%   Values used to calculate the score:     Age: 87 years     Sex: Male     Is Non-Hispanic African American: No     Diabetic: No     Tobacco smoker: Yes     Systolic Blood  Pressure: 132 mmHg     Is BP treated: Yes     HDL Cholesterol: 42.5 mg/dL     Total Cholesterol: 164 mg/dL   ASSESSMENT & PLAN: Active problem addressed during this visit: Umbilical hernia, occasional discomfort affecting quality of life. Needs referral to general surgery for evaluation and treatment.  Cadence was seen today for annual exam.  Diagnoses and all orders for this visit:  Encounter for general adult medical examination with abnormal findings  Need for influenza vaccination -     Flu Vaccine QUAD 15moIM (Fluarix, Fluzone & Alfiuria Quad PF)  Colon cancer screening -     Ambulatory referral to Gastroenterology  Family history of colon cancer in father -     Ambulatory referral to Gastroenterology  OSA on CPAP -     Ambulatory referral to Sleep Studies  Umbilical hernia without obstruction and without gangrene -     Ambulatory referral to General Surgery  Mixed hyperlipidemia -     simvastatin (ZOCOR) 20 MG tablet; Take 1 tablet (20 mg total) by mouth daily. -     Hemoglobin A1c -     Lipid panel  Generalized anxiety disorder -     buPROPion (WELLBUTRIN XL) 300 MG 24 hr tablet; Take 1 tablet (300 mg total) by mouth daily.  Essential hypertension -     lisinopril (ZESTRIL) 10 MG tablet; Take 1 daily for blood pressure  Primary hypertension -     Comprehensive metabolic panel  Prostate cancer screening -     PSA(Must document that pt has been informed of limitations of PSA testing.)  Screening for deficiency anemia -     CBC with Differential  Screening for endocrine, metabolic and immunity disorder -     PSA(Must document that pt has been informed of limitations of PSA testing.)  Other orders -     ALPRAZolam (XANAX) 0.5 MG tablet; Take 1 tablet (0.5 mg total) by mouth daily as needed for anxiety.  Modifiable risk factors discussed with patient. Anticipatory guidance according to age provided. The following topics were also discussed: Social  Determinants of Health Smoking Diet and nutrition Benefits of exercise Cancer screening and need for colon cancer screening  with colonoscopy given his age and family history of colon cancer Vaccinations recommendations Cardiovascular risk assessment The 10-year ASCVD risk score (Arnett DK, et al., 2019) is: 11.3%   Values used to calculate the score:     Age: 69 years     Sex: Male     Is Non-Hispanic African American: No     Diabetic: No     Tobacco smoker: Yes     Systolic Blood Pressure: [id number] mmHg     Is BP treated: Yes     HDL Cholesterol: 42 mg/dL     Total Cholesterol: 157 mg/dL  Mental health including depression and anxiety Fall and accident prevention  Patient Instructions  Health Maintenance, Male Adopting a healthy lifestyle and getting preventive care are important in promoting health and wellness. Ask your health care provider about: The right schedule for you to have regular tests and exams. Things you can do on your own to prevent diseases and keep yourself healthy. What should I know about diet, weight, and exercise? Eat a healthy diet  Eat a diet that includes plenty of vegetables, fruits, low-fat dairy products, and lean protein. Do not eat a lot of foods that are high in solid fats, added sugars, or sodium. Maintain a healthy weight Body mass index (BMI) is a measurement that can be used to identify possible weight problems. It estimates body fat based on height and weight. Your health care provider can help determine your BMI and help you achieve or maintain a healthy weight. Get regular exercise Get regular exercise. This is one of the most important things you can do for your health. Most adults should: Exercise for at least 150 minutes each week. The exercise should increase your heart rate and make you sweat (moderate-intensity exercise). Do strengthening exercises at least twice a week. This is in addition to the moderate-intensity exercise. Spend less  time sitting. Even light physical activity can be beneficial. Watch cholesterol and blood lipids Have your blood tested for lipids and cholesterol at 56 years of age, then have this test every 5 years. You may need to have your cholesterol levels checked more often if: Your lipid or cholesterol levels are high. You are older than 55 years of age. You are at high risk for heart disease. What should I know about cancer screening? Many types of cancers can be detected early and may often be prevented. Depending on your health history and family history, you may need to have cancer screening at various ages. This may include screening for: Colorectal cancer. Prostate cancer. Skin cancer. Lung cancer. What should I know about heart disease, diabetes, and high blood pressure? Blood pressure and heart disease High blood pressure causes heart disease and increases the risk of stroke. This is more likely to develop in people who have high blood pressure readings, are of African descent, or are overweight. Talk with your health care provider about your target blood pressure readings. Have your blood pressure checked: Every 3-5 years if you are 49-58 years of age. Every year if you are 40 years old or older. If you are between the ages of 76 and 56 and are a current or former smoker, ask your health care provider if you should have a one-time screening for abdominal aortic aneurysm (AAA). Diabetes Have regular diabetes screenings. This checks your fasting blood sugar level. Have the screening done: Once every three years after age 9 if you are at a normal weight and have a low risk  for diabetes. More often and at a younger age if you are overweight or have a high risk for diabetes. What should I know about preventing infection? Hepatitis B If you have a higher risk for hepatitis B, you should be screened for this virus. Talk with your health care provider to find out if you are at risk for hepatitis  B infection. Hepatitis C Blood testing is recommended for: Everyone born from [birth year] through 1965. Anyone with known risk factors for hepatitis C. Sexually transmitted infections (STIs) You should be screened each year for STIs, including gonorrhea and chlamydia, if: You are sexually active and are younger than 55 years of age. You are older than 55 years of age and your health care provider tells you that you are at risk for this type of infection. Your sexual activity has changed since you were last screened, and you are at increased risk for chlamydia or gonorrhea. Ask your health care provider if you are at risk. Ask your health care provider about whether you are at high risk for HIV. Your health care provider may recommend a prescription medicine to help prevent HIV infection. If you choose to take medicine to prevent HIV, you should first get tested for HIV. You should then be tested every 3 months for as long as you are taking the medicine. Follow these instructions at home: Lifestyle Do not use any products that contain nicotine or tobacco, such as cigarettes, e-cigarettes, and chewing tobacco. If you need help quitting, ask your health care provider. Do not use street drugs. Do not share needles. Ask your health care provider for help if you need support or information about quitting drugs. Alcohol use Do not drink alcohol if your health care provider tells you not to drink. If you drink alcohol: Limit how much you have to 0-2 drinks a day. Be aware of how much alcohol is in your drink. In the [place] one drink equals one 12 oz bottle of beer (355 mL), one 5 oz glass of wine (148 mL), or one 1 oz glass of hard liquor (44 mL). General instructions Schedule regular health, dental, and eye exams. Stay current with your vaccines. Tell your health care provider if: You often feel depressed. You have ever been abused or do not feel safe at home. Summary Adopting a healthy lifestyle and  getting preventive care are important in promoting health and wellness. Follow your health care provider's instructions about healthy diet, exercising, and getting tested or screened for diseases. Follow your health care provider's instructions on monitoring your cholesterol and blood pressure. This information is not intended to replace advice given to you by your health care provider. Make sure you discuss any questions you have with your health care provider. Document Revised: 11/28/2020 Document Reviewed: 09/13/2018 Elsevier Patient Education  2022 [place], [place] [facility]

## 2021-06-15 NOTE — Patient Instructions (Signed)
Health Maintenance, Male Adopting a healthy lifestyle and getting preventive care are important in promoting health and wellness. Ask your health care provider about: The right schedule for you to have regular tests and exams. Things you can do on your own to prevent diseases and keep yourself healthy. What should I know about diet, weight, and exercise? Eat a healthy diet  Eat a diet that includes plenty of vegetables, fruits, low-fat dairy products, and lean protein. Do not eat a lot of foods that are high in solid fats, added sugars, or sodium. Maintain a healthy weight Body mass index (BMI) is a measurement that can be used to identify possible weight problems. It estimates body fat based on height and weight. Your health care provider can help determine your BMI and help you achieve or maintain a healthy weight. Get regular exercise Get regular exercise. This is one of the most important things you can do for your health. Most adults should: Exercise for at least 150 minutes each week. The exercise should increase your heart rate and make you sweat (moderate-intensity exercise). Do strengthening exercises at least twice a week. This is in addition to the moderate-intensity exercise. Spend less time sitting. Even light physical activity can be beneficial. Watch cholesterol and blood lipids Have your blood tested for lipids and cholesterol at 55 years of age, then have this test every 5 years. You may need to have your cholesterol levels checked more often if: Your lipid or cholesterol levels are high. You are older than 55 years of age. You are at high risk for heart disease. What should I know about cancer screening? Many types of cancers can be detected early and may often be prevented. Depending on your health history and family history, you may need to have cancer screening at various ages. This may include screening for: Colorectal cancer. Prostate cancer. Skin cancer. Lung  cancer. What should I know about heart disease, diabetes, and high blood pressure? Blood pressure and heart disease High blood pressure causes heart disease and increases the risk of stroke. This is more likely to develop in people who have high blood pressure readings, are of African descent, or are overweight. Talk with your health care provider about your target blood pressure readings. Have your blood pressure checked: Every 3-5 years if you are 18-39 years of age. Every year if you are 40 years old or older. If you are between the ages of 65 and 75 and are a current or former smoker, ask your health care provider if you should have a one-time screening for abdominal aortic aneurysm (AAA). Diabetes Have regular diabetes screenings. This checks your fasting blood sugar level. Have the screening done: Once every three years after age 45 if you are at a normal weight and have a low risk for diabetes. More often and at a younger age if you are overweight or have a high risk for diabetes. What should I know about preventing infection? Hepatitis B If you have a higher risk for hepatitis B, you should be screened for this virus. Talk with your health care provider to find out if you are at risk for hepatitis B infection. Hepatitis C Blood testing is recommended for: Everyone born from 1945 through 1965. Anyone with known risk factors for hepatitis C. Sexually transmitted infections (STIs) You should be screened each year for STIs, including gonorrhea and chlamydia, if: You are sexually active and are younger than 55 years of age. You are older than 55 years   of age and your health care provider tells you that you are at risk for this type of infection. Your sexual activity has changed since you were last screened, and you are at increased risk for chlamydia or gonorrhea. Ask your health care provider if you are at risk. Ask your health care provider about whether you are at high risk for HIV.  Your health care provider may recommend a prescription medicine to help prevent HIV infection. If you choose to take medicine to prevent HIV, you should first get tested for HIV. You should then be tested every 3 months for as long as you are taking the medicine. Follow these instructions at home: Lifestyle Do not use any products that contain nicotine or tobacco, such as cigarettes, e-cigarettes, and chewing tobacco. If you need help quitting, ask your health care provider. Do not use street drugs. Do not share needles. Ask your health care provider for help if you need support or information about quitting drugs. Alcohol use Do not drink alcohol if your health care provider tells you not to drink. If you drink alcohol: Limit how much you have to 0-2 drinks a day. Be aware of how much alcohol is in your drink. In the [place] one drink equals one 12 oz bottle of beer (355 mL), one 5 oz glass of wine (148 mL), or one 1 oz glass of hard liquor (44 mL). General instructions Schedule regular health, dental, and eye exams. Stay current with your vaccines. Tell your health care provider if: You often feel depressed. You have ever been abused or do not feel safe at home. Summary Adopting a healthy lifestyle and getting preventive care are important in promoting health and wellness. Follow your health care provider's instructions about healthy diet, exercising, and getting tested or screened for diseases. Follow your health care provider's instructions on monitoring your cholesterol and blood pressure. This information is not intended to replace advice given to you by your health care provider. Make sure you discuss any questions you have with your health care provider. Document Revised: 11/28/2020 Document Reviewed: 09/13/2018 Elsevier Patient Education  2022 [employer]  

## 2021-07-14 ENCOUNTER — Ambulatory Visit (INDEPENDENT_AMBULATORY_CARE_PROVIDER_SITE_OTHER): Payer: 59 | Admitting: Neurology

## 2021-07-14 ENCOUNTER — Encounter: Payer: Self-pay | Admitting: Neurology

## 2021-07-14 VITALS — BP 140/91 | HR 88 | Ht 69.0 in | Wt 216.5 lb

## 2021-07-14 DIAGNOSIS — Z9889 Other specified postprocedural states: Secondary | ICD-10-CM | POA: Diagnosis not present

## 2021-07-14 DIAGNOSIS — G4733 Obstructive sleep apnea (adult) (pediatric): Secondary | ICD-10-CM

## 2021-07-14 DIAGNOSIS — Z9989 Dependence on other enabling machines and devices: Secondary | ICD-10-CM | POA: Diagnosis not present

## 2021-07-14 DIAGNOSIS — G4719 Other hypersomnia: Secondary | ICD-10-CM | POA: Diagnosis not present

## 2021-07-14 NOTE — Patient Instructions (Signed)

## 2021-07-14 NOTE — Progress Notes (Signed)
SLEEP MEDICINE CLINIC    Provider:  Larey Seat, MD  Primary Care Physician:  Horald Pollen, MD [place] [address]     Referring Provider: Horald Pollen, [place],   [id number]          Chief Complaint according to patient   Patient presents with:     New Patient (Initial Visit)           HISTORY OF PRESENT ILLNESS:  [name] is a 55 y.o. year old White or Caucasian male patient seen here as a referral on 07/14/2021 from Dr Mitchel Honour for a new Probation officer. .  Chief concern according to patient :  " many years ago I underwent UPPP and sinus surgery" [name] remembers that this was a miserable recovery time from surgery.  Also it may have reduced his snoring he finally underwent a sleep study after UPPP and was then placed on BiPAP for the treatment of severe sleep apnea.  Sent several years later his CPAP or BiPAP broke and he finally purchased on his bone a CPAP and has been doing well with that but the machine is now a decade old.  It needs to be urgently replaced and the patient is considering himself a CPAP dependent patient. He uses a nasal and mouth covering small FF mask , and reported he did not like a large FFM.      I have the pleasure of seeing [name] 07-14-2021, a right -handed White or Caucasian male with a severe OSA  sleep disorder.  He  has a past medical history of Abdominal pain, Allergy, Anxiety, Asthmatic bronchitis, Barrett's esophagus, Constipation, COVID-19, Depression, Diabetes mellitus with hyperglycemia ([place]), Diverticulitis, Dyspnea, ED (erectile dysfunction), Fatigue, GERD (gastroesophageal reflux disease) (12/2007), HLD (hyperlipidemia), Hypertension, Insomnia, Myalgia, OSA on CPAP, Psoriasis, Restless legs syndrome (RLS), Scoliosis, Sleep apnea, Type 2 diabetes mellitus ([place]), and Vertigo.      Sleep relevant medical history: Nocturia- not on CPAP ,  UPPP/  Tonsillectomy,  lasiks.    Family medical /sleep history: sister with OSA, and COPD. Father was suffering form alcoholism.    Social history:  Patient is working as "[name]" and lives in a household with spouse, all 3 kids are in college.   The patient currently works all day. No on -call  Tobacco use: none quit 5 years ago.  ETOH use: Friday night, WE 2  drinks.  , Caffeine intake in form of Coffee(  2 cups AM  ) Soda( rare) Tea ( /) or energy drinks. Regular exercise - some walking. .   Hobbies : computer.       Sleep habits are as follows: The patient's dinner time is between 5-7  PM. The patient goes to bed at 10-11.30 PM and continues to sleep average  for 6-7 hours. The preferred sleep position is sideways, with the support of 1-2 pillows.  Dreams are reportedly frequent used to have frequent/vivid dreams   6 AM is the usual rise time. The patient wakes up spontaneously or with an alarm.  He reportsfeeling refreshed/ restored in AM, with CPAP use, no naps.    Review of Systems: Out of a complete 14 system review, the patient complains of only the following symptoms, and all other reviewed systems are negative.:  Fatigue, sleepiness , snoring, fragmented sleep,   How likely are you to doze in the following situations: 0 = not likely, 1 =  slight chance, 2 = moderate chance, 3 = high chance   Sitting and Reading? Watching Television? Sitting inactive in a public place (theater or meeting)? As a passenger in a car for an hour without a break? Lying down in the afternoon when circumstances permit? Sitting and talking to someone? Sitting quietly after lunch without alcohol? In a car, while stopped for a few minutes in traffic?   Total = 12/ 24 points   FSS endorsed at 16/ 63 points.   Social History   Socioeconomic History   Marital status: Married    Spouse name: Sherrie Mustache   Number of children: 2   Years of education: 16   Highest education level: Not on file  Occupational  History   Occupation: Scientist, research (physical sciences)    Employer: [employer]  Tobacco Use   Smoking status: Former    Types: Cigarettes    Quit date: 01/19/2007    Years since quitting: 14.4   Smokeless tobacco: Never  Substance and Sexual Activity   Alcohol use: Not Currently   Drug use: No   Sexual activity: Yes    Partners: Female  Other Topics Concern   Not on file  Social History Narrative   Lives with wife (travelling nurse, gone about half the time) from whom he is separated, but on good terms.  Shares custody of his daughters with his first wife.  His current wife has two sons, one grown, the other lives with his father.   Right handed    Caffeine: 2 cups of coffee a day   Social Determinants of Health   Financial Resource Strain: Not on file  Food Insecurity: Not on file  Transportation Needs: Not on file  Physical Activity: Not on file  Stress: Not on file  Social Connections: Not on file    Family History  Problem Relation Age of Onset   Glaucoma Mother    Cataracts Mother        complicated surgery   Prostate cancer Father        metastatic   Hypertension Father    Alcohol abuse Father    Heart disease Father    Diabetes Father    Cirrhosis Father    Gout Father    Arthritis Father    Anxiety disorder Sister    Hypertension Sister    Glaucoma Paternal Uncle    Heart disease Paternal Uncle    Alcohol abuse Paternal Uncle    Colon cancer Paternal Uncle    Heart disease Paternal Uncle    Multiple sclerosis Maternal Aunt    Brain cancer Maternal Uncle    Alcohol abuse Maternal Uncle     Past Medical History:  Diagnosis Date   Abdominal pain    Allergy    seasonal   Anxiety    Asthmatic bronchitis    Barrett's esophagus    Constipation    COVID-19    Depression    Diabetes mellitus with hyperglycemia (HCC)    Diverticulitis    Dyspnea    ED (erectile dysfunction)    Fatigue    GERD (gastroesophageal reflux disease) 12/2007   HLD  (hyperlipidemia)    Hypertension    Insomnia    Myalgia    OSA on CPAP    Psoriasis    Restless legs syndrome (RLS)    Scoliosis    Sleep apnea    Bipap   Type 2 diabetes mellitus ([place])    Vertigo     Past  Surgical History:  Procedure Laterality Date   NASAL SINUS SURGERY     REFRACTIVE SURGERY     bilateral   UPPER GASTROINTESTINAL ENDOSCOPY     UVULECTOMY       Current Outpatient Medications on File Prior to Visit  Medication Sig Dispense Refill   ALPRAZolam (XANAX) 0.5 MG tablet Take 1 tablet (0.5 mg total) by mouth daily as needed for anxiety. 20 tablet 1   aspirin EC 81 MG tablet Take 81 mg by mouth daily.     buPROPion (WELLBUTRIN XL) 300 MG 24 hr tablet Take 1 tablet (300 mg total) by mouth daily. 90 tablet 3   isosorbide mononitrate (IMDUR) 30 MG 24 hr tablet Take 15 mg by mouth daily.     lisinopril (ZESTRIL) 10 MG tablet Take 1 daily for blood pressure 90 tablet 3   Multiple Vitamin (MULTIVITAMIN) tablet Take 1 tablet by mouth daily.     niacin 500 MG tablet Take 1 tablet (500 mg total) by mouth daily with breakfast. 90 tablet 3   Omega-3 Fatty Acids (FISH OIL) 500 MG CAPS Take by mouth daily.     omeprazole (PRILOSEC) 20 MG capsule Take 1 capsule (20 mg total) by mouth 2 (two) times daily before a meal. 180 capsule 3   simvastatin (ZOCOR) 20 MG tablet Take 1 tablet (20 mg total) by mouth daily. 90 tablet 2   zolpidem (AMBIEN) 5 MG tablet Take 1 tablet (5 mg total) by mouth at bedtime as needed. 30 tablet 2   No current facility-administered medications on file prior to visit.    Allergies  Allergen Reactions   Ambien [Zolpidem]     Hypnotics Jan 2019   Bee Venom     Edema Jan 2019   Penicillins     fatigue   Reglan [Metoclopramide]     fatigue   Thimerosal Swelling   Ampicillin Rash    Childhood.   Iodine Rash    Child hood.    Physical exam:  Today's Vitals   07/14/21 1515  BP: (!) 140/91  Pulse: 88  Weight: 216 lb 8 oz (98.2 kg)  Height: 5'  9" (1.753 m)   Body mass index is 31.97 kg/m.   Wt Readings from Last 3 Encounters:  07/14/21 216 lb 8 oz (98.2 kg)  06/15/21 218 lb (98.9 kg)  05/24/20 203 lb (92.1 kg)     Ht Readings from Last 3 Encounters:  07/14/21 5\' 9"  (1.753 m)  06/15/21 5\' 9"  (1.753 m)  05/24/20 5\' 9"  (1.753 m)      General: The patient is awake, alert and appears not in acute distress. The patient is well groomed. Head: Normocephalic, atraumatic. Neck is supple. Mallampati 3, very narrow airway, low palate , high tongue.  neck circumference:17. 5  inches . Nasal airflow  patent.   Retrognathia is seen.  Wisdom teeth.   Cardiovascular:  Regular rate and cardiac rhythm by pulse,  without distended neck veins. Respiratory: Lungs are clear to auscultation.  Skin:  Without evidence of ankle edema, or rash. Trunk: The patient's posture is erect.   Neurologic exam : The patient is awake and alert, oriented to place and time.   Memory subjective described as intact.  Attention span & concentration ability appears normal.  Speech is fluent,  without  dysarthria, dysphonia or aphasia.  Mood and affect are appropriate.   Cranial nerves: no loss of smell or taste reported -  had covid in June 2021- no change.  Pupils are equal and briskly reactive to light. Funduscopic exam deferred. .  Extraocular movements in vertical and horizontal planes were intact and without nystagmus. No Diplopia. Visual fields by finger perimetry are intact. Hearing was intact to soft voice and finger rubbing.   Facial sensation intact to fine touch.  Facial motor strength is symmetric and tongue and uvula move midline.  Neck ROM : rotation, tilt and flexion extension were normal for age and shoulder shrug was symmetrical.    Motor exam:  Symmetric bulk, tone and ROM.   Normal tone without cog wheeling, symmetric grip strength .   Sensory:  Fine touch, pinprick and vibration were tested  and  normal.  Proprioception tested in the  upper extremities was normal.   Coordination: Rapid alternating movements in the fingers/hands were of normal speed.  The Finger-to-nose maneuver was intact without evidence of ataxia, dysmetria or tremor.   Gait and station: Patient could rise unassisted from a seated position, walked without assistive device.  Stance is of normal width/ base and the patient turned with 3 steps.  Toe and heel walk were deferred.  Deep tendon reflexes: in the  upper and lower extremities are symmetric and intact.  Babinski response was deferred normal   [name] is a somewhat hyperactive individual who has been considered CPAP dependent for more than a decade.  He is status post UPPP he still endorses a slight above average level of sleepiness I would call at at 12 out of 24 excessive daytime sleepiness but he keeps physically active and mentally stimulated to combat the feeling.  He has not taken naps in many years.  His CPAP compliance is excellent 100% for time and day 6 hours and 38 minutes on average.  CPAP is set at only 6 cmH2O which is a low pressure and his AHI is 2.0 which means a residual of 2 apneas per hour of sleep a very good resolution.    I would like to look what his baseline may be now.  His 95th percentile pressure is 36.2 for air leak.  Since he is requiring a new prescription for CPAP I think we will use an autotitrator and the mask of his choice that he is most comfortable with it should also provide heated humidification.  I will order a home sleep test so he does not have to come into the sleep lab for a baseline to be established       After spending a total time of  45  minutes face to face and additional time for physical and neurologic examination, review of laboratory studies,  personal review of imaging studies, reports and results of other testing and review of referral information / records as far as provided in visit, I have established the following assessments:  1) likely  still having OSA and Patient is considered CPAP dependent , has not had a sleep study in along time, status post UPPP. 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   3) HTN   My Plan is to proceed with:  1) repeat sleep study to guide new CPAP prescription for this male patient with EDS.  HST .   I would like to thank Horald Pollen, MD and Agustina Caroli Jasper, [place],  [place] [address] for allowing me to meet with and to take care of this pleasant patient.   In short, Andrew  Mathis is presenting with EDS, CPAP , status post UPPP,  I plan to follow up either personally or through our NP within 2-4  month.   CC: I will share my notes with PCP. Marland Kitchen  Electronically signed by: Larey Seat, MD 07/14/2021 3:40 PM  [facility] and [facility] Sleep Board certified by The [employer] of Sleep Medicine and Diplomate of the [employer] of Sleep Medicine. Board certified In Neurology through the [place], Fellow of the [employer] of Neurology. Medical Director of [employer].

## 2021-07-14 NOTE — Telephone Encounter (Signed)
error 

## 2021-08-21 ENCOUNTER — Encounter: Payer: Self-pay | Admitting: Emergency Medicine

## 2021-08-24 ENCOUNTER — Other Ambulatory Visit: Payer: Self-pay | Admitting: Emergency Medicine

## 2021-08-24 ENCOUNTER — Other Ambulatory Visit: Payer: Self-pay

## 2021-08-24 DIAGNOSIS — G479 Sleep disorder, unspecified: Secondary | ICD-10-CM

## 2021-08-24 DIAGNOSIS — K2270 Barrett's esophagus without dysplasia: Secondary | ICD-10-CM

## 2021-08-24 MED ORDER — ZOLPIDEM TARTRATE 5 MG PO TABS: 5.0000 mg | ORAL_TABLET | Freq: Every evening | ORAL | 2 refills | Status: AC | PRN

## 2021-08-24 MED ORDER — ZOLPIDEM TARTRATE 5 MG PO TABS: 5.0000 mg | ORAL_TABLET | Freq: Every evening | ORAL | 2 refills | Status: DC | PRN

## 2021-08-24 MED ORDER — OMEPRAZOLE 20 MG PO CPDR: 20.0000 mg | DELAYED_RELEASE_CAPSULE | Freq: Two times a day (BID) | ORAL | 3 refills | Status: DC

## 2021-08-24 NOTE — Progress Notes (Signed)
Refilled Prilosec to [facility] pharmacy, Ambien prescription printed. Will have provider sign and then fax to pharmacy.

## 2021-08-31 ENCOUNTER — Ambulatory Visit (INDEPENDENT_AMBULATORY_CARE_PROVIDER_SITE_OTHER): Payer: 59 | Admitting: Neurology

## 2021-08-31 DIAGNOSIS — G4733 Obstructive sleep apnea (adult) (pediatric): Secondary | ICD-10-CM | POA: Diagnosis not present

## 2021-08-31 DIAGNOSIS — G4719 Other hypersomnia: Secondary | ICD-10-CM

## 2021-08-31 DIAGNOSIS — Z9889 Other specified postprocedural states: Secondary | ICD-10-CM

## 2021-08-31 DIAGNOSIS — Z9989 Dependence on other enabling machines and devices: Secondary | ICD-10-CM

## 2021-09-02 NOTE — Progress Notes (Signed)
Sent message, via epic in basket, requesting orders in epic from surgeon.  

## 2021-09-08 NOTE — Progress Notes (Signed)
[facility] [place] TEST REPORT ( by Watch PAT)   STUDY DATE: 09-02-2021  DOB:  1966-05-10    ORDERING CLINICIAN: Larey Seat, MD  REFERRING CLINICIAN:    CLINICAL INFORMATION/HISTORY: 07/14/2021 from Dr Mitchel Honour for a sleep consult/ new CPAP evaluation. .  Chief concern according to patient :  " many years ago I underwent UPPP and sinus surgery" Mr. Obeso remembers that this was a miserable recovery time from surgery.  Also it may have reduced his snoring he finally underwent a sleep study after UPPP and was then placed on BiPAP for the treatment of severe sleep apnea.   Several years later his CPAP or BiPAP broke and he finally purchased on his own a PAP and has been doing well with that- but the machine is now a decade old.  It needs to be urgently replaced and the patient is considering himself a CPAP dependent patient. He uses a nasal and mouth covering small FF mask , and reported he did not like a large FFM.    [facility] 07-14-2021, with a severe OSA  sleep disorder and who has a past medical history of Abdominal pain, Allergy, Anxiety, Asthmatic bronchitis, Barrett's esophagus, Constipation, COVID-19, Depression, Diabetes mellitus with hyperglycemia ([place]), Diverticulitis, Dyspnea, ED (erectile dysfunction), Fatigue, GERD (gastroesophageal reflux disease) (12/2007), HLD (hyperlipidemia), Hypertension, Insomnia, Myalgia, OSA on CPAP, Psoriasis, Restless legs syndrome (RLS), Scoliosis, Sleep apnea, Type 2 diabetes mellitus ([place]), and Vertigo.   Epworth sleepiness score: 12/24.   BMI: 32 kg/m   Neck Circumference: 18"   FINDINGS:   Sleep Summary:   Total Recording Time (hours, min):   Total recording time for this home sleep test amounted to 7 hours and 22 minutes, total sleep time was 6 hours 36 minutes, the percentage of REM and sleep time was 21.5%.                           Respiratory Indices:   Calculated pAHI (per hour):   Apnea  hypopnea index was 46.5/h for this patient with a strong non-REM dependent feature.  This patient's REM sleep AHI was 17.9 and his non-REM REM sleep AHI 54.6/h.  Positional apnea data were not obtained and neither snoring data.  The chest wall electrode seems not to have been placed correctly.                                                                         Oxygen Saturation Statistics: O2 Saturation Range (%): Oxygen saturation ranged between a nadir of 77% and a maximum of 100%.   Mean oxygenation saturation was 95%.  O2 Saturation (minutes) <89%:    Time in hypoxia was 7.7 minutes only the equivalent of 2% of total sleep time.       Pulse Rate Statistics:          Pulse Range:    Heart rate varied between 45 bpm and 92 bpm with a mean heart rate of 63 bpm.  IMPRESSION:  This HST confirms the presence of severe sleep apnea with a concerning lower AHI in rem sleep than non-REM sleep.  This could mean that his central apneas present.  Since I do not have positional data there is a limited validity to the study.  Oxygen desaturation seems not to be strictly REM sleep dependent on the overall severity of apnea would justify for the patient to return to BiPAP if central apneas arise during CPAP use.   RECOMMENDATION: auto CPAP first- 6-16 cm water, 2 cm EPR, heated humidification of the device and tubing, mask or interface of patient's choice he has used a nasal mask as well as a F 30 I mask.  Needs to be urgently replaced.    INTERPRETING PHYSICIAN:   Larey Seat, MD   Medical Director of [facility] at [facility].

## 2021-09-11 ENCOUNTER — Ambulatory Visit: Payer: Self-pay | Admitting: Surgery

## 2021-09-11 NOTE — Patient Instructions (Addendum)
DUE TO COVID-19 ONLY ONE VISITOR IS ALLOWED TO COME WITH YOU AND STAY IN THE WAITING ROOM ONLY DURING PRE OP AND PROCEDURE DAY OF SURGERY.   Up to two visitors ages 16+ are allowed at one time in a patient's room.  The visitors may rotate out with other people throughout the day.  Additionally, up to two children between the ages of 47 and 83 are allowed and do not count toward the number of allowed visitors.  Children within this age range must be accompanied by an adult visitor.  One adult visitor may remain with the patient overnight and must be in the room by 8 PM.            Your procedure is scheduled on: 09-22-21   Report to [facility] Main  Entrance   Report to admitting at    0900 AM     Call this number if you have problems the morning of surgery [phone]   Remember: Do not eat food  :After Midnight.  Yo may have clear liquids until 0800 am then nothing by mouth    CLEAR LIQUID DIET                                                                    water Black Coffee and tea, regular and decaf No Creamer                            Plain Jell-O any favor except red or purple                                  Fruit ices (not with fruit pulp)                                      Iced Popsicles                                                                      Cranberry, grape and apple juices Sports drinks like Gatorade Lightly seasoned clear broth or consume(fat free) Sugar, honey syrup   _____________________________________________________________________     BRUSH YOUR TEETH MORNING OF SURGERY AND RINSE YOUR MOUTH OUT, NO CHEWING GUM CANDY OR MINTS.     Take these medicines the morning of surgery with A SIP OF WATER: simvastatin, omeprazole,xanax if needed  DO NOT TAKE ANY DIABETIC MEDICATIONS DAY OF YOUR SURGERY                               You may not have any metal on your body including hair pins and              piercings  Do not wear  jewelry, lotions, powders,perfumes,  deodorant                       Men may shave face and neck.   Do not bring valuables to the hospital. [place].  Contacts, dentures or bridgework may not be worn into surgery.      Patients discharged the day of surgery will not be allowed to drive home. IF YOU ARE HAVING SURGERY AND GOING HOME THE SAME DAY, YOU MUST HAVE AN ADULT TO DRIVE YOU HOME AND BE WITH YOU FOR 24 HOURS. YOU MAY GO HOME BY TAXI OR UBER OR ORTHERWISE, BUT AN ADULT MUST ACCOMPANY YOU HOME AND STAY WITH YOU FOR 24 HOURS.  Name and phone number of your driver:  Special Instructions: N/A              Please read over the following fact sheets you were given: _____________________________________________________________________             [facility] - Preparing for Surgery Before surgery, you can play an important role.  Because skin is not sterile, your skin needs to be as free of germs as possible.  You can reduce the number of germs on your skin by washing with CHG (chlorahexidine gluconate) soap before surgery.  CHG is an antiseptic cleaner which kills germs and bonds with the skin to continue killing germs even after washing. Please DO NOT use if you have an allergy to CHG or antibacterial soaps.  If your skin becomes reddened/irritated stop using the CHG and inform your nurse when you arrive at Short Stay. Do not shave (including legs and underarms) for at least 48 hours prior to the first CHG shower.  You may shave your face/neck. Please follow these instructions carefully:  1.  Shower with CHG Soap the night before surgery and the  morning of Surgery.  2.  If you choose to wash your hair, wash your hair first as usual with your  normal  shampoo.  3.  After you shampoo, rinse your hair and body thoroughly to remove the  shampoo.                           4.  Use CHG as you would any other liquid soap.  You can apply chg  directly  to the skin and wash                       Gently with a scrungie or clean washcloth.  5.  Apply the CHG Soap to your body ONLY FROM THE NECK DOWN.   Do not use on face/ open                           Wound or open sores. Avoid contact with eyes, ears mouth and genitals (private parts).                       Wash face,  Genitals (private parts) with your normal soap.             6.  Wash thoroughly, paying special attention to the area where your surgery  will be performed.  7.  Thoroughly rinse your body with warm water from the neck down.  8.  DO NOT  shower/wash with your normal soap after using and rinsing off  the CHG Soap.                9.  Pat yourself dry with a clean towel.            10.  Wear clean pajamas.            11.  Place clean sheets on your bed the night of your first shower and do not  sleep with pets. Day of Surgery : Do not apply any lotions/deodorants the morning of surgery.  Please wear clean clothes to the hospital/surgery center.  FAILURE TO FOLLOW THESE INSTRUCTIONS MAY RESULT IN THE CANCELLATION OF YOUR SURGERY PATIENT SIGNATURE_________________________________  NURSE SIGNATURE__________________________________  ________________________________________________________________________

## 2021-09-11 NOTE — Progress Notes (Addendum)
PCP - Lurline Hare  , MD Cardiologist - no  PPM/ICD -  Device Orders -  Rep Notified -   Chest x-ray -  EKG -  Stress Test -  ECHO -  Cardiac Cath -   Sleep Study -  CPAP -   Fasting Blood Sugar -  Checks Blood Sugar _____ times a day  Blood Thinner Instructions: Aspirin Instructions:  ERAS Protcol - PRE-SURGERY Ensure or G2-   COVID TEST- N/A COVID vaccine -yes johnson  Activity--Able to walk a flight of stairs without SOB Anesthesia review: OSA cpap, HTN  Patient denies shortness of breath, fever, cough and chest pain at PAT appointment   All instructions explained to the patient, with a verbal understanding of the material. Patient agrees to go over the instructions while at home for a better understanding. Patient also instructed to self quarantine after being tested for COVID-19. The opportunity to ask questions was provided.

## 2021-09-11 NOTE — Progress Notes (Signed)
Please place orders in epic for preop 

## 2021-09-14 ENCOUNTER — Telehealth: Payer: Self-pay

## 2021-09-14 DIAGNOSIS — Z9989 Dependence on other enabling machines and devices: Secondary | ICD-10-CM | POA: Insufficient documentation

## 2021-09-14 DIAGNOSIS — Z9889 Other specified postprocedural states: Secondary | ICD-10-CM | POA: Insufficient documentation

## 2021-09-14 DIAGNOSIS — G4719 Other hypersomnia: Secondary | ICD-10-CM | POA: Insufficient documentation

## 2021-09-14 NOTE — Telephone Encounter (Signed)
I called patient to discuss. No answer, left a message asking him to call us back. If patient calls back, please route to POD 1.

## 2021-09-14 NOTE — Addendum Note (Signed)
Addended by: Larey Seat on: 09/14/2021 12:33 PM   Modules accepted: Orders

## 2021-09-14 NOTE — Procedures (Signed)
[facility] TEST REPORT ( by Watch PAT)   STUDY DATE: 09-02-2021  DOB:  Jun 03, 1966    ORDERING CLINICIAN: Larey Seat, MD  REFERRING CLINICIAN:    CLINICAL INFORMATION/HISTORY: 07/14/2021 from Dr [name] for a sleep consult/ new CPAP evaluation. .  Chief concern according to patient :  " many years ago I underwent UPPP and sinus surgery" [name] remembers that this was a miserable recovery time from surgery.  Also it may have reduced his snoring he finally underwent a sleep study after UPPP and was then placed on BiPAP for the treatment of severe sleep apnea.   Several years later his CPAP or BiPAP broke and he finally purchased on his own a PAP and has been doing well with that- but the machine is now a decade old.  It needs to be urgently replaced and the patient is considering himself a CPAP dependent patient. He uses a nasal and mouth covering small FF mask , and reported he did not like a large FFM.    [facility] 07-14-2021, with a severe OSA  sleep disorder and who has a past medical history of Abdominal pain, Allergy, Anxiety, Asthmatic bronchitis, Barrett's esophagus, Constipation, COVID-19, Depression, Diabetes mellitus with hyperglycemia ([place]), Diverticulitis, Dyspnea, ED (erectile dysfunction), Fatigue, GERD (gastroesophageal reflux disease) (12/2007), HLD (hyperlipidemia), Hypertension, Insomnia, Myalgia, OSA on CPAP, Psoriasis, Restless legs syndrome (RLS), Scoliosis, Sleep apnea, Type 2 diabetes mellitus ([place]), and Vertigo.   Epworth sleepiness score: 12/24.   BMI: 32 kg/m   Neck Circumference: 18"   FINDINGS:   Sleep Summary:   Total Recording Time (hours, min):   Total recording time for this home sleep test amounted to 7 hours and 22 minutes, total sleep time was 6 hours 36 minutes, the percentage of REM and sleep time was 21.5%.                           Respiratory Indices:   Calculated pAHI (per hour):   Apnea hypopnea  index was 46.5/h for this patient with a strong non-REM dependent feature.  This patient's REM sleep AHI was 17.9 and his non-REM REM sleep AHI 54.6/h.  Positional apnea data were not obtained and neither snoring data.  The chest wall electrode seems not to have been placed correctly.                                                                         Oxygen Saturation Statistics: O2 Saturation Range (%): Oxygen saturation ranged between a nadir of 77% and a maximum of 100%.   Mean oxygenation saturation was 95%.  O2 Saturation (minutes) <89%:    Time in hypoxia was 7.7 minutes only the equivalent of 2% of total sleep time.       Pulse Rate Statistics:          Pulse Range:    Heart rate varied between 45 bpm and 92 bpm with a mean heart rate of 63 bpm.  IMPRESSION:  This HST confirms the presence of severe sleep apnea with a concerning lower AHI in rem sleep than non-REM sleep.  This could mean that his central apneas present.  Since I do not have positional data there is a limited validity to the study.  Oxygen desaturation seems not to be strictly REM sleep dependent on the overall severity of apnea would justify for the patient to return to BiPAP if central apneas arise during CPAP use.   RECOMMENDATION: auto CPAP first- 6-16 cm water, 2 cm EPR, heated humidification of the device and tubing, mask or interface of patient's choice he has used a nasal mask as well as a F 30 I mask.  Needs to be urgently replaced.    INTERPRETING PHYSICIAN:   Larey Seat, MD   Medical Director of [facility] at [facility].

## 2021-09-14 NOTE — Telephone Encounter (Signed)
-----   Message from Larey Seat, MD sent at 09/14/2021 12:33 PM EST ----- Apnea hypopnea index was 46.5/h for this patient with a strong non-REM dependent feature.  This patient's REM sleep AHI was 17.9 and his non-REM REM sleep AHI 54.6/h. Positional apnea data were not obtained and neither snoring data.  The chest wall electrode seems not to have  worked correctly.         This HST confirms the presence of severe sleep apnea with a concerning lower AHI in rem sleep than non-REM sleep.  This could mean that his central apneas present.  Since I do not have positional data there is a limited validity to the study.  Oxygen desaturation seems not to be strictly REM sleep dependent on the overall severity of apnea would justify for the patient to return to BiPAP if central apneas arise during CPAP use.  RECOMMENDATION: auto CPAP first- 6-16 cm water, 2 cm EPR, heated humidification of the device and tubing, mask or interface of patient's choice he has used a nasal mask as well as a F 30 I mask.  Needs to be urgently replaced.

## 2021-09-15 ENCOUNTER — Encounter (HOSPITAL_COMMUNITY): Payer: Self-pay

## 2021-09-15 ENCOUNTER — Encounter (HOSPITAL_COMMUNITY)
Admission: RE | Admit: 2021-09-15 | Discharge: 2021-09-15 | Disposition: A | Discharge status: Home / Self Care | Payer: 59 | Source: Ambulatory Visit | Attending: Surgery | Admitting: Surgery

## 2021-09-15 ENCOUNTER — Other Ambulatory Visit: Payer: Self-pay

## 2021-09-15 VITALS — BP 154/107 | HR 76 | Temp 98.7°F | Resp 16 | Ht 69.0 in | Wt 222.0 lb

## 2021-09-15 DIAGNOSIS — Z01818 Encounter for other preprocedural examination: Secondary | ICD-10-CM | POA: Diagnosis not present

## 2021-09-15 DIAGNOSIS — E119 Type 2 diabetes mellitus without complications: Secondary | ICD-10-CM | POA: Diagnosis not present

## 2021-09-15 DIAGNOSIS — I10 Essential (primary) hypertension: Secondary | ICD-10-CM | POA: Diagnosis not present

## 2021-09-15 HISTORY — DX: Family history of other specified conditions: Z84.89

## 2021-09-15 LAB — BASIC METABOLIC PANEL
Anion gap: 10 (ref 5–15)
BUN: 13 mg/dL (ref 6–20)
CO2: 25 mmol/L (ref 22–32)
Calcium: 9 mg/dL (ref 8.9–10.3)
Chloride: 101 mmol/L (ref 98–111)
Creatinine, Ser: 0.71 mg/dL (ref 0.61–1.24)
GFR, Estimated: >60 mL/min (ref >60)
Glucose, Bld: 101 mg/dL — ABNORMAL HIGH (ref 70–99)
Potassium: 4.3 mmol/L (ref 3.5–5.1)
Sodium: 136 mmol/L (ref 135–145)

## 2021-09-15 LAB — CBC
HCT: 46.5 % (ref 39.0–52.0)
Hemoglobin: 15.6 g/dL (ref 13.0–17.0)
MCH: 29.7 pg (ref 26.0–34.0)
MCHC: 33.5 g/dL (ref 30.0–36.0)
MCV: 88.6 fL (ref 80.0–100.0)
Platelets: 301 10*3/uL (ref 150–400)
RBC: 5.25 MIL/uL (ref 4.22–5.81)
RDW: 13.7 % (ref 11.5–15.5)
WBC: 7.6 10*3/uL (ref 4.0–10.5)
nRBC: 0 % (ref 0.0–0.2)

## 2021-09-16 ENCOUNTER — Encounter: Payer: Self-pay | Admitting: Neurology

## 2021-09-17 NOTE — H&P (Signed)
REFERRING PHYSICIAN:  Agustina Caroli, MD   PROVIDER:  Joya San, MD   MRN: [id number] DOB: August 26, 1966 DATE OF ENCOUNTER: 08/19/2021   Subjective    Chief Complaint: New Consultation (Umb hernia)       History of Present Illness: [name] is a 55 y.o. male who is seen today as an office consultation at the request of Dr. Kittie Plater for evaluation of New Consultation (Umb hernia) .     [name] comes in today with an umbilical hernia that he has had for some time.  He says it sometimes is tender when he lifts and is very aware of it.  Offered him the options of observation and repair and he strongly wanted to get this repaired.  I described laparoscopically assisted umbilical hernia repair and placing a small piece of mesh in this umbilical hernia.  He seems to understand this.  We discussed the issues surrounding mesh.  He wants to go ahead and proceed and we will can do this as an outpatient at [facility].     Review of Systems: See HPI as well for other ROS.   ROS      Medical History: Past Medical History      Past Medical History:  Diagnosis Date   Anxiety     GERD (gastroesophageal reflux disease)     Hypertension     Sleep apnea             Patient Active Problem List  Diagnosis   Barrett's esophagus without dysplasia   Essential hypertension   Gastroesophageal reflux disease without esophagitis   History of Barrett's esophagus   History of sleep apnea   OSA on CPAP   Smoker      Past Surgical History       Past Surgical History:  Procedure Laterality Date   FUNCTIONAL ENDOSCOPIC SINUS SURGERY       Uvula removal            Allergies       Allergies  Allergen Reactions   Thimerosal Swelling   Penicillins Rash      Childhood. fatigue Childhood.                Current Outpatient Medications on File Prior to Visit  Medication Sig Dispense Refill   aspirin 81 MG EC tablet Take 1 tablet (81 mg total) by mouth once daily        buPROPion (WELLBUTRIN XL) 300 MG XL tablet Take 1 tablet (300 mg total) by mouth once daily 30 tablet 17   lisinopriL (ZESTRIL) 10 MG tablet Take 1 daily for blood pressure       omeprazole (PRILOSEC) 20 MG DR capsule Take 1 capsule (20 mg total) by mouth once daily       simvastatin (ZOCOR) 20 MG tablet Take 1 tablet (20 mg total) by mouth once daily        No current facility-administered medications on file prior to visit.      Family History       Family History  Problem Relation Age of Onset   High blood pressure (Hypertension) Father     Hyperlipidemia (Elevated cholesterol) Father     Coronary Artery Disease (Blocked arteries around heart) Father     High blood pressure (Hypertension) Sister     Hyperlipidemia (Elevated cholesterol) Sister          Social History        Tobacco Use  Smoking Status  Former   Types: Cigarettes   Quit date: 2021   Years since quitting: 1.8  Smokeless Tobacco Never      Social History  Social History         Socioeconomic History   Marital status: Married  Tobacco Use   Smoking status: Former      Types: Cigarettes      Quit date: 2021      Years since quitting: 1.8   Smokeless tobacco: Never  Vaping Use   Vaping Use: Never used  Substance and Sexual Activity   Alcohol use: Yes   Drug use: Never        Objective:         Vitals:    08/19/21 1357  BP: 132/80  Pulse: 81  SpO2: 97%  Weight: 100.1 kg (220 lb 9.6 oz)  Height: 175.3 cm (5\' 9" )    Body mass index is 32.58 kg/m.   Physical Exam General: A well-developed white male no acute distress. HEENT  : He has had a prior uvulectomy Chest: Clear Heart: Sinus rhythm Breast: Not examined Abdomen: Slightly protuberant and with a small allergy that is supraumbilical consistent with a preperitoneal hernia sac likely with fat GU not examined Rectal not examined Extremities full range of motion Neuro alert and oriented x3.  Motor and sensory function grossly  intact.         Labs, Imaging and Diagnostic Testing: None to review   Assessment and Plan:  Diagnoses and all orders for this visit:   Umbilical hernia without obstruction and without gangrene       Seen in the holding area.  Questions answered.  To OR for repair.        [name], MD

## 2021-09-21 ENCOUNTER — Telehealth: Payer: Self-pay | Admitting: Neurology

## 2021-09-21 NOTE — Telephone Encounter (Signed)
Pt. Called stating he was instructed by RN to call back regarding his sleep study results.

## 2021-09-21 NOTE — Telephone Encounter (Signed)
I called [name]. I advised [name] that Dr. Brett Fairy reviewed their sleep study results and found that [name] has osa. Dr. Brett Fairy recommends that [name] starts auto CPAP. I reviewed PAP compliance expectations with the [name]. [name] is agreeable to starting a CPAP. I advised [name] that an order will be sent to a DME, [facility], and [facility] will call the [name] within about one week after they file with the [name]'s insurance. [facility] will show the [name] how to use the machine, fit for masks, and troubleshoot the CPAP if needed. A follow up appt was made for insurance purposes with Ward Givens, NP on 12/30/21 at 11:30 am. [name] verbalized understanding to arrive 15 minutes early and bring their CPAP. A letter with all of this information in it will be mailed to the [name] as a reminder. I verified with the [name] that the address we have on file is correct. [name] verbalized understanding of results. [name] had no questions at this time but was encouraged to call back if questions arise. I have sent the order to [facility] and have received confirmation that they have received the order.     "Apnea hypopnea index was 46.5/h for this patient with a strong non-REM dependent feature.  This patient's REM sleep AHI was 17.9 and his non-REM REM sleep AHI 54.6/h.  Positional apnea data were not obtained and neither snoring data.  The chest wall electrode seems not to have  worked correctly.         This HST confirms the presence of severe sleep apnea with a concerning lower AHI in rem sleep than non-REM sleep.  This could mean that his central apneas present.  Since I do not have positional data there is a limited validity to the study.  Oxygen desaturation seems not to be strictly REM sleep dependent on the overall severity of apnea would justify for the patient to return to BiPAP if central apneas arise during CPAP use.     RECOMMENDATION: auto CPAP first- 6-16 cm water, 2 cm EPR, heated humidification of the device and tubing, mask or interface of patient's choice  he has used a nasal mask as well as a F 30 I mask.  Needs to be urgently replaced."

## 2021-09-22 ENCOUNTER — Encounter (HOSPITAL_COMMUNITY): Payer: Self-pay | Admitting: Surgery

## 2021-09-22 ENCOUNTER — Ambulatory Visit (HOSPITAL_COMMUNITY)
Admission: RE | Admit: 2021-09-22 | Discharge: 2021-09-22 | Disposition: A | Discharge status: Home / Self Care | Payer: 59 | Attending: Surgery | Admitting: Surgery

## 2021-09-22 ENCOUNTER — Ambulatory Visit (HOSPITAL_COMMUNITY): Payer: 59 | Admitting: Physician Assistant

## 2021-09-22 ENCOUNTER — Encounter (HOSPITAL_COMMUNITY)
Admission: RE | Disposition: A | Discharge status: Home / Self Care | Payer: Self-pay | Source: Home / Self Care | Attending: Surgery

## 2021-09-22 DIAGNOSIS — K219 Gastro-esophageal reflux disease without esophagitis: Secondary | ICD-10-CM | POA: Insufficient documentation

## 2021-09-22 DIAGNOSIS — F419 Anxiety disorder, unspecified: Secondary | ICD-10-CM | POA: Diagnosis not present

## 2021-09-22 DIAGNOSIS — G4730 Sleep apnea, unspecified: Secondary | ICD-10-CM | POA: Diagnosis not present

## 2021-09-22 DIAGNOSIS — Z87891 Personal history of nicotine dependence: Secondary | ICD-10-CM | POA: Diagnosis not present

## 2021-09-22 DIAGNOSIS — I10 Essential (primary) hypertension: Secondary | ICD-10-CM | POA: Diagnosis not present

## 2021-09-22 DIAGNOSIS — K429 Umbilical hernia without obstruction or gangrene: Secondary | ICD-10-CM | POA: Diagnosis not present

## 2021-09-22 HISTORY — PX: UMBILICAL HERNIA REPAIR: SHX196

## 2021-09-22 SURGERY — REPAIR, HERNIA, UMBILICAL, LAPAROSCOPIC
Anesthesia: General | Site: Abdomen

## 2021-09-22 MED ORDER — PHENYLEPHRINE HCL (PRESSORS) 10 MG/ML IV SOLN
INTRAVENOUS | Status: DC | PRN
  Administered 2021-09-22 (×2): 80 ug via INTRAVENOUS

## 2021-09-22 MED ORDER — CEFAZOLIN SODIUM-DEXTROSE 2-4 GM/100ML-% IV SOLN
2.0000 g | INTRAVENOUS | Status: AC
  Administered 2021-09-22: 14:00:00 2 g via INTRAVENOUS
  Filled 2021-09-22: qty 100

## 2021-09-22 MED ORDER — OXYCODONE HCL 5 MG PO TABS: 5.0000 mg | ORAL_TABLET | Freq: Once | ORAL | Status: DC | PRN

## 2021-09-22 MED ORDER — OXYCODONE HCL 5 MG/5ML PO SOLN: 5.0000 mg | Freq: Once | ORAL | Status: DC | PRN

## 2021-09-22 MED ORDER — PROPOFOL 10 MG/ML IV BOLUS
INTRAVENOUS | Status: DC | PRN
  Administered 2021-09-22: 200 mg via INTRAVENOUS

## 2021-09-22 MED ORDER — CHLORHEXIDINE GLUCONATE 0.12 % MT SOLN
15.0000 mL | Freq: Once | OROMUCOSAL | Status: AC
  Administered 2021-09-22: 11:00:00 15 mL via OROMUCOSAL

## 2021-09-22 MED ORDER — BUPIVACAINE LIPOSOME 1.3 % IJ SUSP: 20.0000 mL | Freq: Once | INTRAMUSCULAR | Status: DC

## 2021-09-22 MED ORDER — FENTANYL CITRATE (PF) 100 MCG/2ML IJ SOLN
INTRAMUSCULAR | Status: DC | PRN
  Administered 2021-09-22: 100 ug via INTRAVENOUS

## 2021-09-22 MED ORDER — ONDANSETRON HCL 4 MG/2ML IJ SOLN: 4.0000 mg | Freq: Once | INTRAMUSCULAR | Status: DC | PRN

## 2021-09-22 MED ORDER — MIDAZOLAM HCL 2 MG/2ML IJ SOLN
INTRAMUSCULAR | Status: AC
  Filled 2021-09-22: qty 2

## 2021-09-22 MED ORDER — CHLORHEXIDINE GLUCONATE CLOTH 2 % EX PADS: 6.0000 | MEDICATED_PAD | Freq: Once | CUTANEOUS | Status: DC

## 2021-09-22 MED ORDER — BUPIVACAINE LIPOSOME 1.3 % IJ SUSP
INTRAMUSCULAR | Status: DC | PRN
  Administered 2021-09-22: 20 mL

## 2021-09-22 MED ORDER — LIDOCAINE 2% (20 MG/ML) 5 ML SYRINGE
INTRAMUSCULAR | Status: DC | PRN
  Administered 2021-09-22: 100 mg via INTRAVENOUS

## 2021-09-22 MED ORDER — LACTATED RINGERS IV SOLN: INTRAVENOUS | Status: DC

## 2021-09-22 MED ORDER — ORAL CARE MOUTH RINSE: 15.0000 mL | Freq: Once | OROMUCOSAL | Status: AC

## 2021-09-22 MED ORDER — MIDAZOLAM HCL 5 MG/5ML IJ SOLN
INTRAMUSCULAR | Status: DC | PRN
  Administered 2021-09-22: 2 mg via INTRAVENOUS

## 2021-09-22 MED ORDER — 0.9 % SODIUM CHLORIDE (POUR BTL) OPTIME
TOPICAL | Status: DC | PRN
  Administered 2021-09-22: 14:00:00 1000 mL

## 2021-09-22 MED ORDER — ACETAMINOPHEN 500 MG PO TABS
1000.0000 mg | ORAL_TABLET | ORAL | Status: AC
  Administered 2021-09-22: 11:00:00 1000 mg via ORAL
  Filled 2021-09-22: qty 2

## 2021-09-22 MED ORDER — ROCURONIUM BROMIDE 10 MG/ML (PF) SYRINGE
PREFILLED_SYRINGE | INTRAVENOUS | Status: DC | PRN
  Administered 2021-09-22: 60 mg via INTRAVENOUS

## 2021-09-22 MED ORDER — FENTANYL CITRATE PF 50 MCG/ML IJ SOSY: 25.0000 ug | PREFILLED_SYRINGE | INTRAMUSCULAR | Status: DC | PRN

## 2021-09-22 MED ORDER — HYDROCODONE-ACETAMINOPHEN 5-325 MG PO TABS: 1.0000 | ORAL_TABLET | Freq: Four times a day (QID) | ORAL | 0 refills | Status: DC | PRN

## 2021-09-22 MED ORDER — ACETAMINOPHEN 500 MG PO TABS: 1000.0000 mg | ORAL_TABLET | Freq: Once | ORAL | Status: DC

## 2021-09-22 MED ORDER — BUPIVACAINE LIPOSOME 1.3 % IJ SUSP
INTRAMUSCULAR | Status: AC
  Filled 2021-09-22: qty 20

## 2021-09-22 MED ORDER — SUGAMMADEX SODIUM 200 MG/2ML IV SOLN
INTRAVENOUS | Status: DC | PRN
Start: 2021-09-22 — End: 2021-09-22
  Administered 2021-09-22: 200 mg via INTRAVENOUS

## 2021-09-22 MED ORDER — AMISULPRIDE (ANTIEMETIC) 5 MG/2ML IV SOLN: 10.0000 mg | Freq: Once | INTRAVENOUS | Status: DC | PRN

## 2021-09-22 MED ORDER — FENTANYL CITRATE (PF) 100 MCG/2ML IJ SOLN
INTRAMUSCULAR | Status: AC
  Filled 2021-09-22: qty 2

## 2021-09-22 MED ORDER — HEPARIN SODIUM (PORCINE) 5000 UNIT/ML IJ SOLN
5000.0000 [IU] | Freq: Once | INTRAMUSCULAR | Status: AC
  Administered 2021-09-22: 11:00:00 5000 [IU] via SUBCUTANEOUS
  Filled 2021-09-22: qty 1

## 2021-09-22 MED ORDER — ONDANSETRON HCL 4 MG/2ML IJ SOLN
INTRAMUSCULAR | Status: DC | PRN
  Administered 2021-09-22: 4 mg via INTRAVENOUS

## 2021-09-22 MED ORDER — DEXAMETHASONE SODIUM PHOSPHATE 10 MG/ML IJ SOLN
INTRAMUSCULAR | Status: DC | PRN
  Administered 2021-09-22: 10 mg via INTRAVENOUS

## 2021-09-22 MED ORDER — LIDOCAINE HCL (PF) 2 % IJ SOLN
INTRAMUSCULAR | Status: DC | PRN
  Administered 2021-09-22: 1.5 mg/kg/h via INTRADERMAL

## 2021-09-22 MED ORDER — SCOPOLAMINE 1 MG/3DAYS TD PT72
1.0000 | MEDICATED_PATCH | TRANSDERMAL | Status: DC
  Administered 2021-09-22: 11:00:00 1.5 mg via TRANSDERMAL
  Filled 2021-09-22: qty 1

## 2021-09-22 SURGICAL SUPPLY — 47 items
BAG COUNTER SPONGE SURGICOUNT (BAG) IMPLANT
BAG SURGICOUNT SPONGE COUNTING (BAG)
BINDER ABDOMINAL 12 ML 46-62 (SOFTGOODS) ×2 IMPLANT
CLOSURE WOUND 1/2 X4 (GAUZE/BANDAGES/DRESSINGS)
COTTON BALL STERILE (GAUZE/BANDAGES/DRESSINGS) ×3
COTTON BALL STERILE 2 PK (GAUZE/BANDAGES/DRESSINGS) IMPLANT
COVER SURGICAL LIGHT HANDLE (MISCELLANEOUS) ×3 IMPLANT
DECANTER SPIKE VIAL GLASS SM (MISCELLANEOUS) ×3 IMPLANT
DERMABOND ADVANCED (GAUZE/BANDAGES/DRESSINGS) ×2
DERMABOND ADVANCED .7 DNX12 (GAUZE/BANDAGES/DRESSINGS) IMPLANT
DEVICE SECURE STRAP 25 ABSORB (INSTRUMENTS) ×2 IMPLANT
DEVICE TROCAR PUNCTURE CLOSURE (ENDOMECHANICALS) ×2 IMPLANT
DISSECTOR BLUNT TIP ENDO 5MM (MISCELLANEOUS) IMPLANT
DRAIN CHANNEL 19F RND (DRAIN) IMPLANT
ELECT REM PT RETURN 15FT ADLT (MISCELLANEOUS) ×3 IMPLANT
EVACUATOR SILICONE 100CC (DRAIN) IMPLANT
GLOVE SURG ENC TEXT LTX SZ8 (GLOVE) ×3 IMPLANT
GOWN SPEC L4 XLG W/TWL (GOWN DISPOSABLE) ×3 IMPLANT
GOWN STRL REUS W/TWL XL LVL3 (GOWN DISPOSABLE) ×6 IMPLANT
IRRIG SUCT STRYKERFLOW 2 WTIP (MISCELLANEOUS)
IRRIGATION SUCT STRKRFLW 2 WTP (MISCELLANEOUS) IMPLANT
KIT BASIN OR (CUSTOM PROCEDURE TRAY) ×3 IMPLANT
KIT TURNOVER KIT A (KITS) IMPLANT
MARKER SKIN DUAL TIP RULER LAB (MISCELLANEOUS) IMPLANT
MESH VENTRALEX ST 1-7/10 CRC S (Mesh General) ×2 IMPLANT
NDL SPNL 22GX3.5 QUINCKE BK (NEEDLE) IMPLANT
NEEDLE SPNL 22GX3.5 QUINCKE BK (NEEDLE) IMPLANT
PAD POSITIONING PINK XL (MISCELLANEOUS) IMPLANT
PENCIL SMOKE EVACUATOR (MISCELLANEOUS) IMPLANT
PROTECTOR NERVE ULNAR (MISCELLANEOUS) IMPLANT
SCISSORS LAP 5X45 EPIX DISP (ENDOMECHANICALS) ×3 IMPLANT
SET TUBE SMOKE EVAC HIGH FLOW (TUBING) ×3 IMPLANT
SLEEVE XCEL OPT CAN 5 100 (ENDOMECHANICALS) ×3 IMPLANT
STAPLER VISISTAT 35W (STAPLE) IMPLANT
STRIP CLOSURE SKIN 1/2X4 (GAUZE/BANDAGES/DRESSINGS) IMPLANT
SUT NOVA 0 T19/GS 22DT (SUTURE) IMPLANT
SUT NOVA NAB DX-16 0-1 5-0 T12 (SUTURE) ×3 IMPLANT
SUT NOVA NAB GS-21 0 18 T12 DT (SUTURE) IMPLANT
SUT VIC AB 4-0 SH 18 (SUTURE) ×3 IMPLANT
TACKER 5MM HERNIA 3.5CML NAB (ENDOMECHANICALS) IMPLANT
TAPE CLOTH SURG 4X10 WHT LF (GAUZE/BANDAGES/DRESSINGS) ×2 IMPLANT
TOWEL OR 17X26 10 PK STRL BLUE (TOWEL DISPOSABLE) ×3 IMPLANT
TOWEL OR NON WOVEN STRL DISP B (DISPOSABLE) ×3 IMPLANT
TRAY FOLEY MTR SLVR 16FR STAT (SET/KITS/TRAYS/PACK) IMPLANT
TRAY LAPAROSCOPIC (CUSTOM PROCEDURE TRAY) ×3 IMPLANT
TROCAR BLADELESS OPT 5 100 (ENDOMECHANICALS) ×3 IMPLANT
TROCAR XCEL NON-BLD 11X100MML (ENDOMECHANICALS) IMPLANT

## 2021-09-22 NOTE — Anesthesia Postprocedure Evaluation (Signed)
Anesthesia Post Note  Patient: [name]  Procedure(s) Performed: LAPAROSCOPIC REPAIR OF UMBILICAL HERNIA WITH INSERTION OF MESH (Abdomen)     Patient location during evaluation: PACU Anesthesia Type: General Level of consciousness: awake and alert Pain management: pain level controlled Vital Signs Assessment: post-procedure vital signs reviewed and stable Respiratory status: spontaneous breathing, nonlabored ventilation and respiratory function stable Cardiovascular status: blood pressure returned to baseline and stable Postop Assessment: no apparent nausea or vomiting Anesthetic complications: no   No notable events documented.  Last Vitals:  Vitals:   09/22/21 1445 09/22/21 1500  BP: (!) 121/95 (!) 130/95  Pulse: 68 68  Resp: 18 18  Temp:    SpO2: 94% 96%    Last Pain:  Vitals:   09/22/21 1445  TempSrc:   PainSc: 0-No pain                 Lidia Collum

## 2021-09-22 NOTE — Progress Notes (Signed)
Patient was informed that his surgeon has been delayed this morning and  his surgery will not start on time. Patient voiced understanding.  Called patients wife to inform her also, no answer but a voicemail was left.

## 2021-09-22 NOTE — Progress Notes (Signed)
Spoke with PACU staff- PT will be discharged home (no CPAP will be needed).

## 2021-09-22 NOTE — Interval H&P Note (Signed)
History and Physical Interval Note:  09/22/2021 12:39 PM  [name]  has presented today for surgery, with the diagnosis of UMBILICAL HERNIA.  The various methods of treatment have been discussed with the patient and family. After consideration of risks, benefits and other options for treatment, the patient has consented to  Procedure(s): [place] (N/A) as a surgical intervention.  The patient's history has been reviewed, patient examined, no change in status, stable for surgery.  I have reviewed the patient's chart and labs.  Questions were answered to the patient's satisfaction.     Pedro Earls

## 2021-09-22 NOTE — Transfer of Care (Signed)
Immediate Anesthesia Transfer of Care Note  Patient: [name]  Procedure(s) Performed: LAPAROSCOPIC REPAIR OF UMBILICAL HERNIA WITH INSERTION OF MESH (Abdomen)  Patient Location: PACU  Anesthesia Type:General  Level of Consciousness: awake, alert , oriented and patient cooperative  Airway & Oxygen Therapy: Patient Spontanous Breathing and Patient connected to face mask oxygen  Post-op Assessment: Report given to RN and Post -op Vital signs reviewed and stable  Post vital signs: Reviewed and stable  Last Vitals:  Vitals Value Taken Time  BP    Temp    Pulse 56 09/22/21 1422  Resp 13 09/22/21 1422  SpO2 100 % 09/22/21 1422  Vitals shown include unvalidated device data.  Last Pain:  Vitals:   09/22/21 1120  TempSrc:   PainSc: 3       Patients Stated Pain Goal: 2 (47/99/87 2158)  Complications: No notable events documented.

## 2021-09-22 NOTE — Anesthesia Procedure Notes (Addendum)
Procedure Name: Intubation Date/Time: 09/22/2021 1:24 PM Performed by: Cleda Daub, CRNA Pre-anesthesia Checklist: Patient identified, Emergency Drugs available, Suction available and Patient being monitored Patient Re-evaluated:Patient Re-evaluated prior to induction Oxygen Delivery Method: Circle system utilized Preoxygenation: Pre-oxygenation with 100% oxygen Induction Type: IV induction Ventilation: Two handed mask ventilation required Laryngoscope Size: Mac and 4 Theodora Blow, SRNA intubated under supervision.) Grade View: Grade II Tube type: Oral Tube size: 7.5 mm Number of attempts: 1 Airway Equipment and Method: Stylet Placement Confirmation: ETT inserted through vocal cords under direct vision, positive ETCO2 and breath sounds checked- equal and bilateral Secured at: 22 cm Tube secured with: Tape Dental Injury: Teeth and Oropharynx as per pre-operative assessment

## 2021-09-22 NOTE — Anesthesia Preprocedure Evaluation (Signed)
Anesthesia Evaluation  Patient identified by MRN, date of birth, ID band Patient awake    Reviewed: Allergy & Precautions, NPO status , Patient's Chart, lab work & pertinent test results  History of Anesthesia Complications Negative for: history of anesthetic complications  Airway Mallampati: II  TM Distance: >3 FB Neck ROM: Full    Dental  (+) Teeth Intact   Pulmonary sleep apnea and Continuous Positive Airway Pressure Ventilation , former smoker,    Pulmonary exam normal        Cardiovascular hypertension, Normal cardiovascular exam     Neuro/Psych Anxiety negative neurological ROS     GI/Hepatic Neg liver ROS, GERD  ,  Endo/Other  negative endocrine ROS  Renal/GU negative Renal ROS  negative genitourinary   Musculoskeletal negative musculoskeletal ROS (+)   Abdominal   Peds  Hematology negative hematology ROS (+)   Anesthesia Other Findings   Reproductive/Obstetrics                             Anesthesia Physical Anesthesia Plan  ASA: 2  Anesthesia Plan: General   Post-op Pain Management: Tylenol PO (pre-op) and Toradol IV (intra-op)   Induction: Intravenous  PONV Risk Score and Plan: 2 and Ondansetron, Dexamethasone, Treatment may vary due to age or medical condition and Midazolam  Airway Management Planned: Oral ETT  Additional Equipment: None  Intra-op Plan:   Post-operative Plan: Extubation in OR  Informed Consent: I have reviewed the patients History and Physical, chart, labs and discussed the procedure including the risks, benefits and alternatives for the proposed anesthesia with the patient or authorized representative who has indicated his/her understanding and acceptance.     Dental advisory given  Plan Discussed with:   Anesthesia Plan Comments:         Anesthesia Quick Evaluation

## 2021-09-22 NOTE — Op Note (Signed)
22 September 2021   Horald Pollen, MD  Preop diagnosis: Umbilical hernia  Postop diagnosisi same  Surgeon:  Isabel Caprice. Hassell Done, MD, FACS  Asst:   None    Anesthesia:  General  Description of procedure: The patient was taken to OR 2 at [facility].  After general anesthesia was administered the patient was prepped with chloroprep and a time out performed.    Access to the abdomen was achieved with a 5 mm Optiview through the left upper quadrant.  The angle scope was placed and the hernia defect was noted.  The hernia was ~ 1 cm in diameter.  It contained no incarcerated material.  Any herniated material was removed.    The umbilical incision was infraumbilical and curvilinear.  The umbilical skin was dissected from the hernia sac.  The hernia sac was freed and stripped.  The resultant defect was approximately 1 cm and a piece of Ventralex mesh was chosen to bridge the fascial defect.  It was secured to the fascia with 0 Novafil.    A second 5 mm trocar was placed in the left lower quadrant and this was used to place secure strap absorbable tack.  The umbilical skin and the trocar sites were closed with 4-0 vicryl or 4-0 Monocryl and Dermabond.    An abdominal binder was placed and the patient was taken to the recovery room in satisfactory condition.     Matt B. Hassell Done, MD, FACS

## 2021-09-23 ENCOUNTER — Encounter (HOSPITAL_COMMUNITY): Payer: Self-pay | Admitting: Surgery

## 2021-10-06 ENCOUNTER — Encounter: Payer: Self-pay | Admitting: Emergency Medicine

## 2021-10-06 ENCOUNTER — Ambulatory Visit (INDEPENDENT_AMBULATORY_CARE_PROVIDER_SITE_OTHER): Payer: 59 | Admitting: Emergency Medicine

## 2021-10-06 ENCOUNTER — Other Ambulatory Visit: Payer: Self-pay

## 2021-10-06 VITALS — BP 146/80 | HR 87 | Ht 69.0 in | Wt 222.0 lb

## 2021-10-06 DIAGNOSIS — I10 Essential (primary) hypertension: Secondary | ICD-10-CM | POA: Diagnosis not present

## 2021-10-06 DIAGNOSIS — M792 Neuralgia and neuritis, unspecified: Secondary | ICD-10-CM | POA: Diagnosis not present

## 2021-10-06 DIAGNOSIS — M5412 Radiculopathy, cervical region: Secondary | ICD-10-CM | POA: Insufficient documentation

## 2021-10-06 DIAGNOSIS — M542 Cervicalgia: Secondary | ICD-10-CM

## 2021-10-06 MED ORDER — GABAPENTIN 300 MG PO CAPS: 300.0000 mg | ORAL_CAPSULE | Freq: Two times a day (BID) | ORAL | 3 refills | Status: DC

## 2021-10-06 NOTE — Progress Notes (Signed)
[name] 56 y.o.   Chief Complaint  Patient presents with   Arm Pain    X 1 wk, severe pain in right arm   Hypertension    Pt states he had hernia surgery x 2 wks ago and BP has been elevated since    HISTORY OF PRESENT ILLNESS: This is a 56 y.o. male complaining of intermittent sharp pain to right arm that started several weeks ago. Had ventral hernia repair 2 weeks ago. Pain starts in the neck area and travels to right upper shoulder and down right arm.  No known triggers. Comes and goes sometimes lasting 3 to 4 hours.  Unbearable pain.  Not associated with any other symptoms. No recent injuries.  Extending neck sometimes triggers the pain. No other complaints or medical concerns today. Blood pressure has been elevated for the last couple of weeks. Presently taking lisinopril 10 mg daily. BP Readings from Last 3 Encounters:  10/06/21 (!) 146/80  09/22/21 (!) 124/95  09/15/21 (!) 154/107     HPI   Prior to Admission medications   Medication Sig Start Date End Date Taking? Authorizing Provider  ALPRAZolam Duanne Moron) 0.5 MG tablet Take 1 tablet (0.5 mg total) by mouth daily as needed for anxiety. 06/15/21  Yes Horald Pollen, MD  aspirin EC 81 MG tablet Take 81 mg by mouth daily.   Yes [provider]  buPROPion (WELLBUTRIN XL) 300 MG 24 hr tablet Take 1 tablet (300 mg total) by mouth daily. 06/15/21 10/06/21 Yes Bijan Ridgley, Ines Bloomer, MD  HYDROcodone-acetaminophen (NORCO/VICODIN) 5-325 MG tablet Take 1 tablet by mouth every 6 (six) hours as needed for moderate pain. 09/22/21  Yes Johnathan Hausen, MD  ibuprofen (ADVIL) 200 MG tablet Take 400 mg by mouth every 6 (six) hours as needed for headache.   Yes [provider]  lisinopril (ZESTRIL) 10 MG tablet Take 1 daily for blood pressure Patient taking differently: Take 10 mg by mouth daily. 06/15/21  Yes Johany Hansman, Ines Bloomer, MD  Lysine 1000 MG TABS Take 1,000 mg by mouth daily.   Yes [provider]  Multiple Vitamin (MULTIVITAMIN) tablet Take 1 tablet by mouth daily.   Yes [provider]  niacin 500 MG tablet Take 1 tablet (500 mg total) by mouth daily with breakfast. 05/11/18  Yes Teisha Trowbridge, Ines Bloomer, MD  Omega-3 Fatty Acids (FISH OIL ULTRA) 1400 MG CAPS Take 1,400 mg by mouth daily.   Yes [provider]  omeprazole (PRILOSEC) 20 MG capsule Take 1 capsule (20 mg total) by mouth 2 (two) times daily before a meal. 08/24/21  Yes Avalyn Molino, Ines Bloomer, MD  simvastatin (ZOCOR) 20 MG tablet Take 1 tablet (20 mg total) by mouth daily. 06/15/21  Yes Jauan Wohl, Ines Bloomer, MD  zolpidem (AMBIEN) 5 MG tablet Take 1 tablet (5 mg total) by mouth at bedtime as needed. 08/24/21  Yes Horald Pollen, MD    Allergies  Allergen Reactions   Thimerosal Swelling   Ampicillin Rash    Childhood.   Iodine Rash    Child hood.    Patient Active Problem List   Diagnosis Date Noted   Excessive daytime sleepiness 09/14/2021   CPAP (continuous positive airway pressure) dependence 09/14/2021   S/P UPPP (uvulopalatopharyngoplasty) 09/14/2021   History of sleep apnea 05/11/2018   Generalized anxiety disorder 05/11/2018   Sleep disturbance 05/11/2018   History of Barrett's esophagus 05/11/2018   Abnormal EKG 11/24/2016   Smoker 11/24/2016   Family history of coronary  artery disease 11/24/2016   Hyperlipemia 08/09/2015   Gastroesophageal reflux disease without esophagitis 08/09/2015   Essential hypertension 08/09/2015   HTN (hypertension) 08/22/2012   Anxiety 08/22/2012   Barrett's esophagus without dysplasia 08/22/2012   OSA on CPAP     Past Medical History:  Diagnosis Date   Abdominal pain    Allergy    seasonal   Anxiety    Asthmatic bronchitis    Barrett's esophagus    Constipation    COVID-19    Diverticulitis    ED (erectile dysfunction)    Family history of adverse reaction to anesthesia    mother has hard time coming out of anethesia   Fatigue     GERD (gastroesophageal reflux disease) 12/2007   HLD (hyperlipidemia)    Hypertension    Insomnia    Myalgia    OSA on CPAP    Psoriasis    Scoliosis    Sleep apnea    Bipap   Vertigo     Past Surgical History:  Procedure Laterality Date   NASAL SINUS SURGERY     REFRACTIVE SURGERY     bilateral   UMBILICAL HERNIA REPAIR N/A 09/22/2021   Procedure: LAPAROSCOPIC REPAIR OF UMBILICAL HERNIA WITH INSERTION OF MESH;  Surgeon: Johnathan Hausen, MD;  Location: [facility];  Service: General;  Laterality: N/A;   UPPER GASTROINTESTINAL ENDOSCOPY     UVULECTOMY      Social History   Socioeconomic History   Marital status: Married    Spouse name: Sherrie Mustache   Number of children: 2   Years of education: 16   Highest education level: Not on file  Occupational History   Occupation: Scientist, research (physical sciences)    Employer: [employer]  Tobacco Use   Smoking status: Former    Types: Cigarettes    Quit date: 01/19/2007    Years since quitting: 14.7   Smokeless tobacco: Never  Vaping Use   Vaping Use: Never used  Substance and Sexual Activity   Alcohol use: Not Currently    Comment: occasional   Drug use: No   Sexual activity: Yes    Partners: Female    Birth control/protection: None  Other Topics Concern   Not on file  Social History Narrative   Lives with wife (travelling Marine scientist, gone about half the time) from whom he is separated, but on good terms.  Shares custody of his daughters with his first wife.  His current wife has two sons, one grown, the other lives with his father.   Right handed    Caffeine: 2 cups of coffee a day   Social Determinants of Health   Financial Resource Strain: Not on file  Food Insecurity: Not on file  Transportation Needs: Not on file  Physical Activity: Not on file  Stress: Not on file  Social Connections: Not on file  Intimate Partner Violence: Not on file    Family History  Problem Relation Age of Onset   Glaucoma Mother     Cataracts Mother        complicated surgery   Prostate cancer Father        metastatic   Hypertension Father    Alcohol abuse Father    Heart disease Father    Diabetes Father    Cirrhosis Father    Gout Father    Arthritis Father    Anxiety disorder Sister    Hypertension Sister    Glaucoma Paternal Uncle    Heart disease Paternal  Uncle    Alcohol abuse Paternal Uncle    Colon cancer Paternal Uncle    Heart disease Paternal Uncle    Multiple sclerosis Maternal Aunt    Brain cancer Maternal Uncle    Alcohol abuse Maternal Uncle      Review of Systems  Constitutional: Negative.  Negative for chills and fever.  HENT: Negative.  Negative for congestion and sore throat.   Respiratory: Negative.  Negative for cough and shortness of breath.   Cardiovascular: Negative.  Negative for chest pain and palpitations.  Gastrointestinal: Negative.  Negative for abdominal pain, diarrhea, nausea and vomiting.  Genitourinary: Negative.  Negative for hematuria.  Musculoskeletal:  Positive for neck pain.  Skin: Negative.  Negative for rash.  Neurological:  Positive for sensory change. Negative for dizziness, speech change, focal weakness and headaches.  All other systems reviewed and are negative.   Physical Exam Vitals reviewed.  Constitutional:      Appearance: Normal appearance.  HENT:     Head: Normocephalic.  Eyes:     Extraocular Movements: Extraocular movements intact.     Conjunctiva/sclera: Conjunctivae normal.     Pupils: Pupils are equal, round, and reactive to light.  Cardiovascular:     Rate and Rhythm: Normal rate and regular rhythm.     Pulses: Normal pulses.     Heart sounds: Normal heart sounds.  Pulmonary:     Effort: Pulmonary effort is normal.     Breath sounds: Normal breath sounds.  Musculoskeletal:     Cervical back: No tenderness.     Right lower leg: No edema.     Left lower leg: No edema.  Lymphadenopathy:     Cervical: No cervical adenopathy.   Skin:    General: Skin is warm and dry.     Capillary Refill: Capillary refill takes less than 2 seconds.  Neurological:     General: No focal deficit present.     Mental Status: He is alert and oriented to person, place, and time.  Psychiatric:        Mood and Affect: Mood normal.        Behavior: Behavior normal.     ASSESSMENT & PLAN: Problem List Items Addressed This Visit       Cardiovascular and Mediastinum   Essential hypertension    Elevated blood pressure readings at home and in the office. Advised to increase dose of lisinopril to 20 mg daily and continue monitoring blood pressure readings at home daily for the next several weeks and keep a log.        Nervous and Auditory   Cervical radiculopathy    Continue ibuprofen and hydrocodone as needed. Start gabapentin 300 mg twice a day.      Relevant Medications   gabapentin (NEURONTIN) 300 MG capsule   Other Relevant Orders   MR Cervical Spine Wo Contrast     Other   Neuropathic pain - Primary    Most likely secondary to cervical radiculopathy. Will try gabapentin 300 mg twice a day. Will need cervical spine MRI.      Relevant Medications   gabapentin (NEURONTIN) 300 MG capsule   Other Visit Diagnoses     Cervicalgia       Relevant Orders   MR Cervical Spine Wo Contrast      Patient Instructions  Cervical Radiculopathy Cervical radiculopathy means that a nerve in the neck (a cervical nerve) is pinched or bruised. This can happen because of an injury to the cervical  spine (vertebrae) in the neck, or as a normal part of getting older. This condition can cause pain or loss of feeling (numbness) that runs from your neck all the way down to your arm and fingers. Often, this condition gets better with rest. Treatment may be needed if the condition does not get better. What are the causes? A neck injury. A bulging disk in your spine. Sudden muscle tightening (muscle spasms). Tight muscles in your neck due  to overuse. Arthritis. Breakdown in the bones and joints of the spine (spondylosis) due to getting older. Bone spurs that form near the nerves in the neck. What are the signs or symptoms? Pain. The pain may: Run from the neck to the arm and hand. Be very bad or irritating. Get worse when you move your neck. Loss of feeling or tingling in your arm or hand. Weakness in your arm or hand, in very bad cases. How is this treated? In many cases, treatment is not needed for this condition. With rest, the condition often gets better over time. If treatment is needed, options may include: Wearing a soft neck collar (cervical collar) for short periods of time. Doing exercises (physical therapy) to strengthen your neck muscles. Taking medicines. Having shots (injections) in your spine, in very bad cases. Having surgery. This may be needed if other treatments do not help. The type of surgery that is used will depend on the cause of your condition. Follow these instructions at home: If you have a soft neck collar: Wear it as told by your doctor. Take it off only as told by your doctor. Ask your doctor if you can take the collar off for cleaning and bathing. If you are allowed to take the collar off for cleaning or bathing: Follow instructions from your doctor about how to take off the collar safely. Clean the collar by wiping it with mild soap and water and drying it completely. Take out any removable pads in the collar every 1-2 days. Wash them by hand with soap and water. Let them air-dry completely before you put them back in the collar. Check your skin under the collar for redness or sores. If you see any, tell your doctor. Managing pain   Take over-the-counter and prescription medicines only as told by your doctor. If told, put ice on the painful area. To do this: If you have a soft neck collar, take if off as told by your doctor. Put ice in a plastic bag. Place a towel between your skin and  the bag. Leave the ice on for 20 minutes, 2-3 times a day. Take off the ice if your skin turns bright red. This is very important. If you cannot feel pain, heat, or cold, you have a greater risk of damage to the area. If using ice does not help, you can try using heat. Use the heat source that your doctor recommends, such as a moist heat pack or a heating pad. Place a towel between your skin and the heat source. Leave the heat on for 20-30 minutes. Take off the heat if your skin turns bright red. This is very important. If you cannot feel pain, heat, or cold, you have a greater risk of getting burned. You may try a gentle neck and shoulder rub (massage). Activity Rest as needed. Return to your normal activities when your doctor says that it is safe. Do exercises as told by your doctor or physical therapist. You may have to avoid lifting. Ask your  doctor how much you can safely lift. General instructions Use a flat pillow when you sleep. Do not drive while wearing a soft neck collar. If you do not have a soft neck collar, ask your doctor if it is safe to drive while your neck heals. Ask your doctor if you should avoid driving or using machines while you are taking your medicine. Do not smoke or use any products that contain nicotine or tobacco. If you need help quitting, ask your doctor. Keep all follow-up visits. Contact a doctor if: Your condition does not get better with treatment. Get help right away if: Your pain gets worse and medicine does not help. You lose feeling or feel weak in your hand, arm, face, or leg. You have a high fever. Your neck is stiff. You cannot control when you poop or pee (have incontinence). You have trouble with walking, balance, or talking. Summary Cervical radiculopathy means that a nerve in the neck is pinched or bruised. A nerve can get pinched from a bulging disk, arthritis, an injury to the neck, or other causes. Symptoms include pain, tingling, or  loss of feeling that goes from the neck to the arm or hand. Weakness in your arm or hand can happen in very bad cases. Treatment may include resting, wearing a soft neck collar, and doing exercises. You might need to take medicines for pain. In very bad cases, shots or surgery may be needed. This information is not intended to replace advice given to you by your health care provider. Make sure you discuss any questions you have with your health care provider. Document Revised: 03/26/2021 Document Reviewed: 03/26/2021 Elsevier Patient Education  2022 [place], [place] [facility]

## 2021-10-06 NOTE — Assessment & Plan Note (Signed)
Most likely secondary to cervical radiculopathy. Will try gabapentin 300 mg twice a day. Will need cervical spine MRI.

## 2021-10-06 NOTE — Assessment & Plan Note (Signed)
Elevated blood pressure readings at home and in the office. Advised to increase dose of lisinopril to 20 mg daily and continue monitoring blood pressure readings at home daily for the next several weeks and keep a log.

## 2021-10-06 NOTE — Patient Instructions (Signed)
Cervical Radiculopathy ?Cervical radiculopathy means that a nerve in the neck (a cervical nerve) is pinched or bruised. This can happen because of an injury to the cervical spine (vertebrae) in the neck, or as a normal part of getting older. This condition can cause pain or loss of feeling (numbness) that runs from your neck all the way down to your arm and fingers. Often, this condition gets better with rest. Treatment may be needed if the condition does not get better. ?What are the causes? ?A neck injury. ?A bulging disk in your spine. ?Sudden muscle tightening (muscle spasms). ?Tight muscles in your neck due to overuse. ?Arthritis. ?Breakdown in the bones and joints of the spine (spondylosis) due to getting older. ?Bone spurs that form near the nerves in the neck. ?What are the signs or symptoms? ?Pain. The pain may: ?Run from the neck to the arm and hand. ?Be very bad or irritating. ?Get worse when you move your neck. ?Loss of feeling or tingling in your arm or hand. ?Weakness in your arm or hand, in very bad cases. ?How is this treated? ?In many cases, treatment is not needed for this condition. With rest, the condition often gets better over time. If treatment is needed, options may include: ?Wearing a soft neck collar (cervical collar) for short periods of time. ?Doing exercises (physical therapy) to strengthen your neck muscles. ?Taking medicines. ?Having shots (injections) in your spine, in very bad cases. ?Having surgery. This may be needed if other treatments do not help. The type of surgery that is used will depend on the cause of your condition. ?Follow these instructions at home: ?If you have a soft neck collar: ?Wear it as told by your doctor. Take it off only as told by your doctor. ?Ask your doctor if you can take the collar off for cleaning and bathing. If you are allowed to take the collar off for cleaning or bathing: ?Follow instructions from your doctor about how to take off the collar  safely. ?Clean the collar by wiping it with mild soap and water and drying it completely. ?Take out any removable pads in the collar every 1-2 days. Wash them by hand with soap and water. Let them air-dry completely before you put them back in the collar. ?Check your skin under the collar for redness or sores. If you see any, tell your doctor. ?Managing pain ?  ?Take over-the-counter and prescription medicines only as told by your doctor. ?If told, put ice on the painful area. To do this: ?If you have a soft neck collar, take if off as told by your doctor. ?Put ice in a plastic bag. ?Place a towel between your skin and the bag. ?Leave the ice on for 20 minutes, 2-3 times a day. ?Take off the ice if your skin turns bright red. This is very important. If you cannot feel pain, heat, or cold, you have a greater risk of damage to the area. ?If using ice does not help, you can try using heat. Use the heat source that your doctor recommends, such as a moist heat pack or a heating pad. ?Place a towel between your skin and the heat source. ?Leave the heat on for 20-30 minutes. ?Take off the heat if your skin turns bright red. This is very important. If you cannot feel pain, heat, or cold, you have a greater risk of getting burned. ?You may try a gentle neck and shoulder rub (massage). ?Activity ?Rest as needed. ?Return to your normal   activities when your doctor says that it is safe. ?Do exercises as told by your doctor or physical therapist. ?You may have to avoid lifting. Ask your doctor how much you can safely lift. ?General instructions ?Use a flat pillow when you sleep. ?Do not drive while wearing a soft neck collar. If you do not have a soft neck collar, ask your doctor if it is safe to drive while your neck heals. ?Ask your doctor if you should avoid driving or using machines while you are taking your medicine. ?Do not smoke or use any products that contain nicotine or tobacco. If you need help quitting, ask your  doctor. ?Keep all follow-up visits. ?Contact a doctor if: ?Your condition does not get better with treatment. ?Get help right away if: ?Your pain gets worse and medicine does not help. ?You lose feeling or feel weak in your hand, arm, face, or leg. ?You have a high fever. ?Your neck is stiff. ?You cannot control when you poop or pee (have incontinence). ?You have trouble with walking, balance, or talking. ?Summary ?Cervical radiculopathy means that a nerve in the neck is pinched or bruised. ?A nerve can get pinched from a bulging disk, arthritis, an injury to the neck, or other causes. ?Symptoms include pain, tingling, or loss of feeling that goes from the neck to the arm or hand. ?Weakness in your arm or hand can happen in very bad cases. ?Treatment may include resting, wearing a soft neck collar, and doing exercises. You might need to take medicines for pain. In very bad cases, shots or surgery may be needed. ?This information is not intended to replace advice given to you by your health care provider. Make sure you discuss any questions you have with your health care provider. ?Document Revised: 03/26/2021 Document Reviewed: 03/26/2021 ?Elsevier Patient Education ? 2022 [employer] ? ?

## 2021-10-06 NOTE — Assessment & Plan Note (Signed)
Continue ibuprofen and hydrocodone as needed. Start gabapentin 300 mg twice a day.

## 2021-10-13 ENCOUNTER — Encounter: Payer: Self-pay | Admitting: Emergency Medicine

## 2021-10-14 ENCOUNTER — Telehealth: Payer: Self-pay

## 2021-10-14 DIAGNOSIS — M5412 Radiculopathy, cervical region: Secondary | ICD-10-CM

## 2021-10-14 NOTE — Telephone Encounter (Signed)
Please look into status of cervical spine MRI order.  Thanks.

## 2021-10-14 NOTE — Telephone Encounter (Signed)
Changed location for MR scan to [place] [facility] due to [facility] imaging not taking Friday insurance plans. Ophelia Charter in referrals advised the location change.

## 2021-11-05 ENCOUNTER — Ambulatory Visit (HOSPITAL_COMMUNITY): Payer: 59

## 2022-01-05 ENCOUNTER — Encounter: Payer: Self-pay | Admitting: Adult Health

## 2022-01-05 ENCOUNTER — Ambulatory Visit: Payer: 59 | Admitting: Adult Health

## 2022-01-05 VITALS — BP 147/106 | HR 78 | Ht 69.0 in | Wt 222.6 lb

## 2022-01-05 DIAGNOSIS — G4733 Obstructive sleep apnea (adult) (pediatric): Secondary | ICD-10-CM | POA: Diagnosis not present

## 2022-01-05 DIAGNOSIS — Z9989 Dependence on other enabling machines and devices: Secondary | ICD-10-CM | POA: Diagnosis not present

## 2022-01-05 NOTE — Patient Instructions (Signed)
Continue using CPAP nightly and greater than 4 hours each night °If your symptoms worsen or you develop new symptoms please let us know.  ° °

## 2022-01-05 NOTE — Progress Notes (Signed)
? ? ?PATIENT: [name] ?DOB: 09/03/1966 ? ?REASON FOR VISIT: follow up ?HISTORY FROM: patient ?PRIMARY NEUROLOGIST: Dr. Brett Fairy ? ?Chief Complaint  ?Patient presents with  ? Follow-up  ?  Rm 4, alone. CPAP Initial follow up 10-08-2021 set up.  Pt did not take Bp meds this am.    ? ? ?HISTORY OF PRESENT ILLNESS: ?Today 01/05/22: ? ?Mr. Paino is a 56 year old male with a history of OSA on CPAP. He returns today for follow-up.  He reports that his new CPAP is working well.  He denies any new issues. ? ? ? ? ? ?REVIEW OF SYSTEMS: Out of a complete 14 system review of symptoms, the patient complains only of the following symptoms, and all other reviewed systems are negative. ? ? ?ESS 10 ? ?ALLERGIES: ?Allergies  ?Allergen Reactions  ? Thimerosal Swelling  ? Ampicillin Rash  ?  Childhood.  ? Iodine Rash  ?  Child hood.  ? ? ?HOME MEDICATIONS: ?Outpatient Medications Prior to Visit  ?Medication Sig Dispense Refill  ? ALPRAZolam (XANAX) 0.5 MG tablet Take 1 tablet (0.5 mg total) by mouth daily as needed for anxiety. 20 tablet 1  ? aspirin EC 81 MG tablet Take 81 mg by mouth daily.    ? lisinopril (ZESTRIL) 10 MG tablet Take 1 daily for blood pressure (Patient taking differently: Take 10 mg by mouth daily.) 90 tablet 3  ? Lysine 1000 MG TABS Take 1,000 mg by mouth daily.    ? Multiple Vitamin (MULTIVITAMIN) tablet Take 1 tablet by mouth daily.    ? niacin 500 MG tablet Take 1 tablet (500 mg total) by mouth daily with breakfast. 90 tablet 3  ? Omega-3 Fatty Acids (FISH OIL ULTRA) 1400 MG CAPS Take 1,400 mg by mouth daily.    ? omeprazole (PRILOSEC) 20 MG capsule Take 1 capsule (20 mg total) by mouth 2 (two) times daily before a meal. 180 capsule 3  ? simvastatin (ZOCOR) 20 MG tablet Take 1 tablet (20 mg total) by mouth daily. 90 tablet 2  ? zolpidem (AMBIEN) 5 MG tablet Take 1 tablet (5 mg total) by mouth at bedtime as needed. 30 tablet 2  ? HYDROcodone-acetaminophen (NORCO/VICODIN) 5-325 MG tablet Take 1 tablet  by mouth every 6 (six) hours as needed for moderate pain. 15 tablet 0  ? ibuprofen (ADVIL) 200 MG tablet Take 400 mg by mouth every 6 (six) hours as needed for headache.    ? buPROPion (WELLBUTRIN XL) 300 MG 24 hr tablet Take 1 tablet (300 mg total) by mouth daily. 90 tablet 3  ? gabapentin (NEURONTIN) 300 MG capsule Take 1 capsule (300 mg total) by mouth 2 (two) times daily for 7 days. 14 capsule 3  ? ?No facility-administered medications prior to visit.  ? ? ?PAST MEDICAL HISTORY: ?Past Medical History:  ?Diagnosis Date  ? Abdominal pain   ? Allergy   ? seasonal  ? Anxiety   ? Asthmatic bronchitis   ? Barrett's esophagus   ? Constipation   ? COVID-19   ? Diverticulitis   ? ED (erectile dysfunction)   ? Family history of adverse reaction to anesthesia   ? mother has hard time coming out of anethesia  ? Fatigue   ? GERD (gastroesophageal reflux disease) 12/2007  ? HLD (hyperlipidemia)   ? Hypertension   ? Insomnia   ? Myalgia   ? OSA on CPAP   ? Psoriasis   ? Scoliosis   ? Sleep apnea   ?  Bipap  ? Vertigo   ? ? ?PAST SURGICAL HISTORY: ?Past Surgical History:  ?Procedure Laterality Date  ? NASAL SINUS SURGERY    ? REFRACTIVE SURGERY    ? bilateral  ? UMBILICAL HERNIA REPAIR N/A 09/22/2021  ? Procedure: LAPAROSCOPIC REPAIR OF UMBILICAL HERNIA WITH INSERTION OF MESH;  Surgeon: Johnathan Hausen, MD;  Location: [facility];  Service: General;  Laterality: N/A;  ? UPPER GASTROINTESTINAL ENDOSCOPY    ? UVULECTOMY    ? ? ?FAMILY HISTORY: ?Family History  ?Problem Relation Age of Onset  ? Glaucoma Mother   ? Cataracts Mother   ?     complicated surgery  ? Prostate cancer Father   ?     metastatic  ? Hypertension Father   ? Alcohol abuse Father   ? Heart disease Father   ? Diabetes Father   ? Cirrhosis Father   ? Gout Father   ? Arthritis Father   ? Anxiety disorder Sister   ? Hypertension Sister   ? Glaucoma Paternal Uncle   ? Heart disease Paternal Uncle   ? Alcohol abuse Paternal Uncle   ? Colon cancer Paternal Uncle   ? Heart  disease Paternal Uncle   ? Multiple sclerosis Maternal Aunt   ? Brain cancer Maternal Uncle   ? Alcohol abuse Maternal Uncle   ? ? ?SOCIAL HISTORY: ?Social History  ? ?Socioeconomic History  ? Marital status: Married  ?  Spouse name: Sherrie Mustache  ? Number of children: 2  ? Years of education: 52  ? Highest education level: Not on file  ?Occupational History  ? Occupation: TECHNICIAN MANAGER  ?  Employer: [employer]  ?Tobacco Use  ? Smoking status: Former  ?  Types: Cigarettes  ?  Quit date: 01/19/2007  ?  Years since quitting: 14.9  ? Smokeless tobacco: Never  ?Vaping Use  ? Vaping Use: Never used  ?Substance and Sexual Activity  ? Alcohol use: Not Currently  ?  Comment: occasional  ? Drug use: No  ? Sexual activity: Yes  ?  Partners: Female  ?  Birth control/protection: None  ?Other Topics Concern  ? Not on file  ?Social History Narrative  ? Lives with wife (travelling nurse, gone about half the time) from whom he is separated, but on good terms.  Shares custody of his daughters with his first wife.  His current wife has two sons, one grown, the other lives with his father.  ? Right handed   ? Caffeine: 2 cups of coffee a day  ? ?Social Determinants of Health  ? ?Financial Resource Strain: Not on file  ?Food Insecurity: Not on file  ?Transportation Needs: Not on file  ?Physical Activity: Not on file  ?Stress: Not on file  ?Social Connections: Not on file  ?Intimate Partner Violence: Not on file  ? ? ? ? ?PHYSICAL EXAM ? ?Vitals:  ? 01/05/22 1131  ?BP: (!) 147/106  ?Pulse: 78  ?Weight: 222 lb 9.6 oz (101 kg)  ?Height: '5\' 9"'$  (1.753 m)  ? ?Body mass index is 32.87 kg/m?. ? ?Generalized: Well developed, in no acute distress  ?Chest: Lungs clear to auscultation bilaterally ? ?Neurological examination  ?Mentation: Alert oriented to time, place, history taking. Follows all commands speech and language fluent ?Cranial nerve II-XII: Extraocular movements were full, visual field were full on confrontational test  Head turning and shoulder shrug  were normal and symmetric. ?Gait and station: Gait is normal.  ? ? ?DIAGNOSTIC DATA (LABS, IMAGING,  TESTING) ?- I reviewed patient records, labs, notes, testing and imaging myself where available. ? ?Lab Results  ?Component Value Date  ? WBC 7.6 09/15/2021  ? HGB 15.6 09/15/2021  ? HCT 46.5 09/15/2021  ? MCV 88.6 09/15/2021  ? PLT 301 09/15/2021  ? ?   ?Component Value Date/Time  ? NA 136 09/15/2021 1148  ? NA 140 03/27/2020 1807  ? K 4.3 09/15/2021 1148  ? CL 101 09/15/2021 1148  ? CO2 25 09/15/2021 1148  ? GLUCOSE 101 (H) 09/15/2021 1148  ? BUN 13 09/15/2021 1148  ? BUN 12 03/27/2020 1807  ? CREATININE 0.71 09/15/2021 1148  ? CREATININE 0.85 08/09/2015 1033  ? CALCIUM 9.0 09/15/2021 1148  ? PROT 6.7 06/15/2021 0850  ? PROT 7.1 03/27/2020 1807  ? ALBUMIN 4.3 06/15/2021 0850  ? ALBUMIN 4.8 03/27/2020 1807  ? AST 17 06/15/2021 0850  ? ALT 26 06/15/2021 0850  ? ALKPHOS 98 06/15/2021 0850  ? BILITOT 0.5 06/15/2021 0850  ? BILITOT <0.2 03/27/2020 1807  ? GFRNONAA >60 09/15/2021 1148  ? [facility] >89 08/09/2015 1033  ? GFRAA >60 05/24/2020 1848  ? GFRAA >89 08/09/2015 1033  ? ?Lab Results  ?Component Value Date  ? CHOL 164 06/15/2021  ? HDL 42.50 06/15/2021  ? LDLCALC 101 (H) 06/15/2021  ? TRIG 104.0 06/15/2021  ? CHOLHDL 4 06/15/2021  ? ?Lab Results  ?Component Value Date  ? HGBA1C 5.8 06/15/2021  ? ?No results found for: VITAMINB12 ?Lab Results  ?Component Value Date  ? TSH 0.908 06/21/2019  ? ? ? ? ?ASSESSMENT AND PLAN ?56 y.o. year old male  has a past medical history of Abdominal pain, Allergy, Anxiety, Asthmatic bronchitis, Barrett's esophagus, Constipation, COVID-19, Diverticulitis, ED (erectile dysfunction), Family history of adverse reaction to anesthesia, Fatigue, GERD (gastroesophageal reflux disease) (12/2007), HLD (hyperlipidemia), Hypertension, Insomnia, Myalgia, OSA on CPAP, Psoriasis, Scoliosis, Sleep apnea, and Vertigo. here with: ? ?OSA on CPAP ? ?- CPAP compliance  excellent ?- Good treatment of AHI  ?- Encourage patient to use CPAP nightly and > 4 hours each night ?- F/U in 1 year or sooner if needed ? ?. ? ?Ward Givens, MSN, NP-C 01/05/2022, 11:40 AM ?[facility]

## 2022-03-17 IMAGING — US US ABDOMEN LIMITED
1 series · 14 of 25 positions shown · non-contrast
Comparison: None.

CLINICAL DATA: Epigastric pain

EXAM:
ULTRASOUND ABDOMEN LIMITED RIGHT UPPER QUADRANT

[Series 1: us abdomen limited · 14 of 33 slices shown]
[im 1/33]
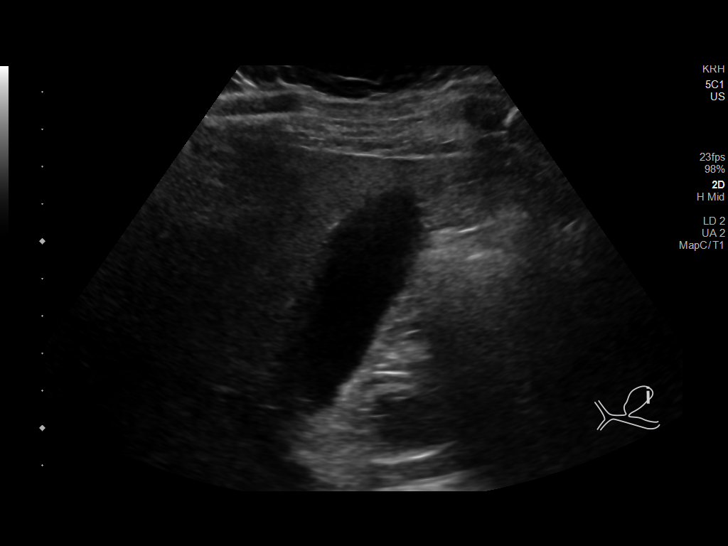
[im 3/33]
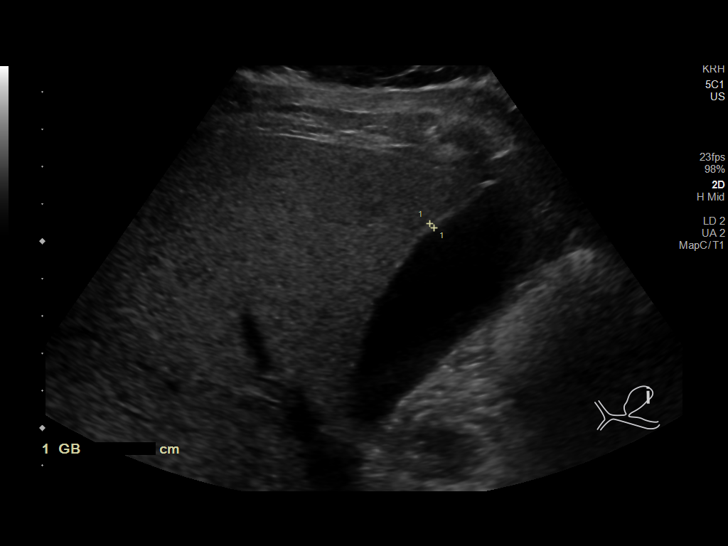
[im 6/33]
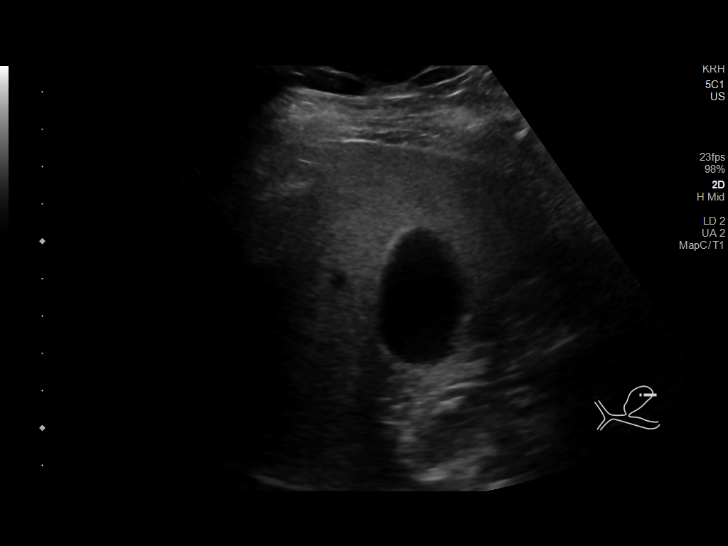
[im 9/33]
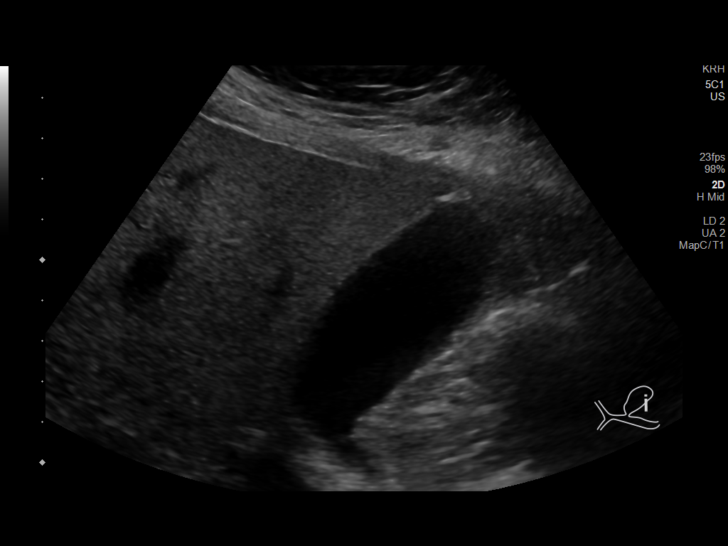
[im 11/33]
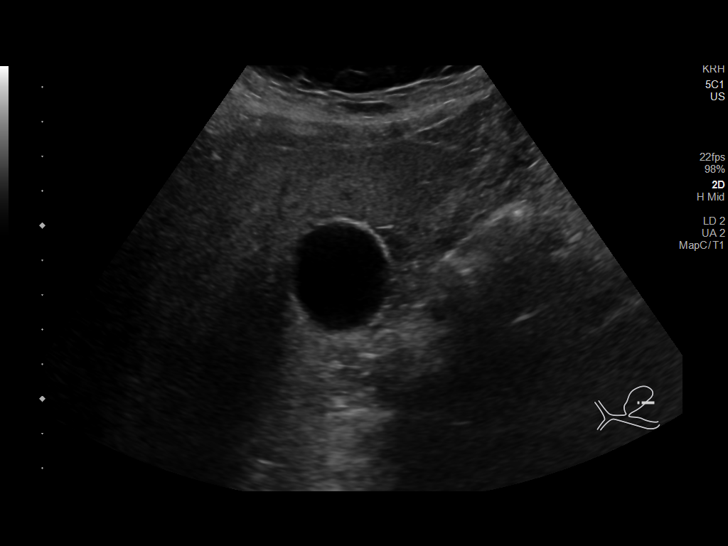
[im 13/33]
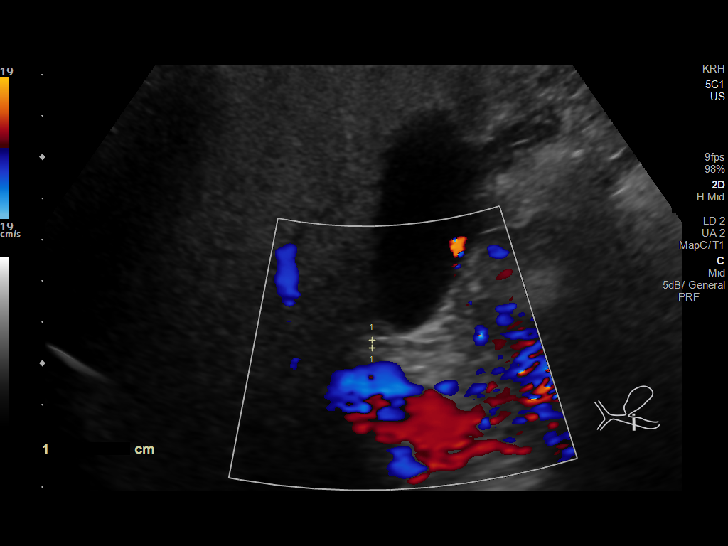
[im 15/33]
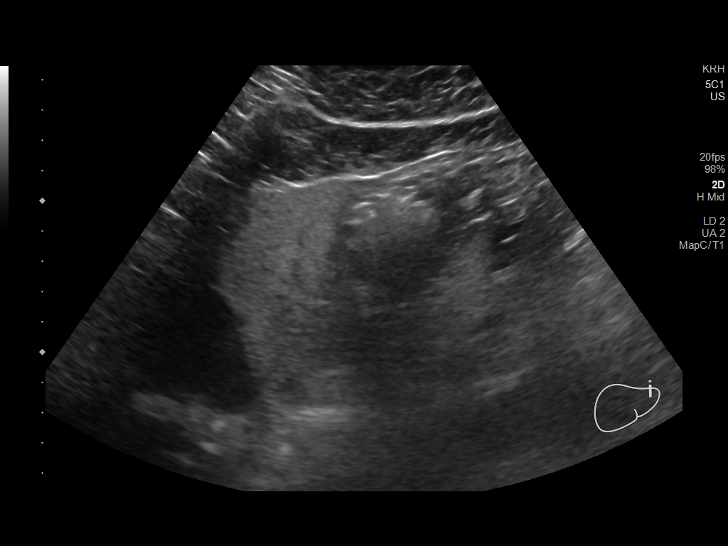
[im 18/33]
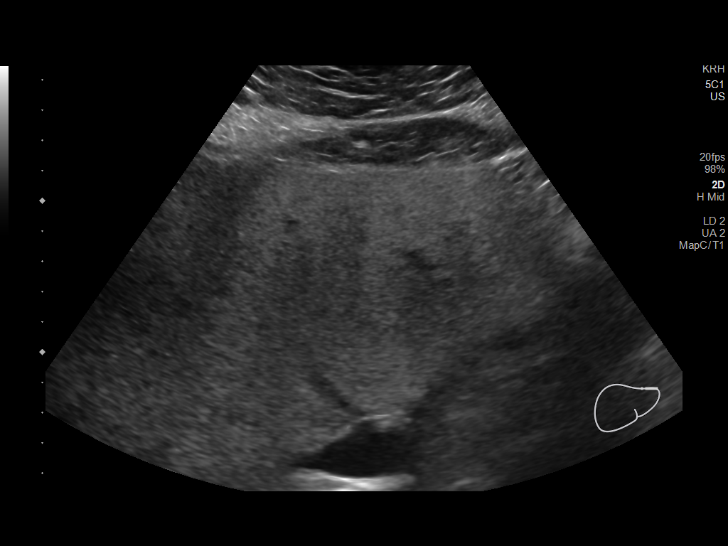
[im 21/33]
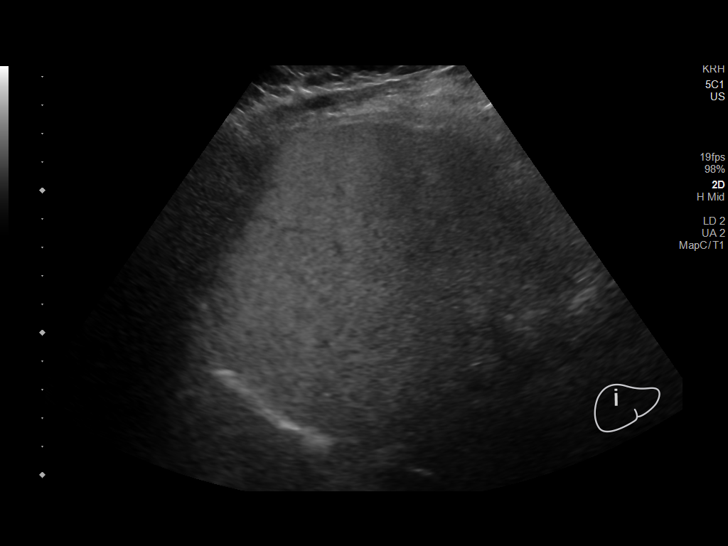
[im 22/33]
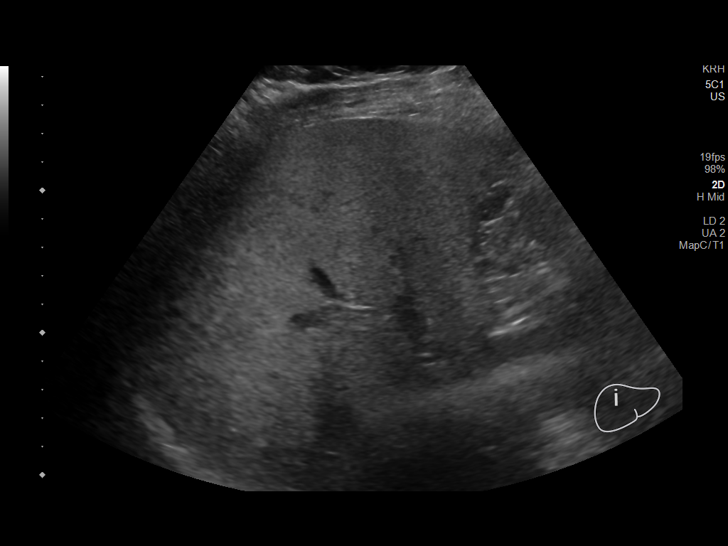
[im 25/33]
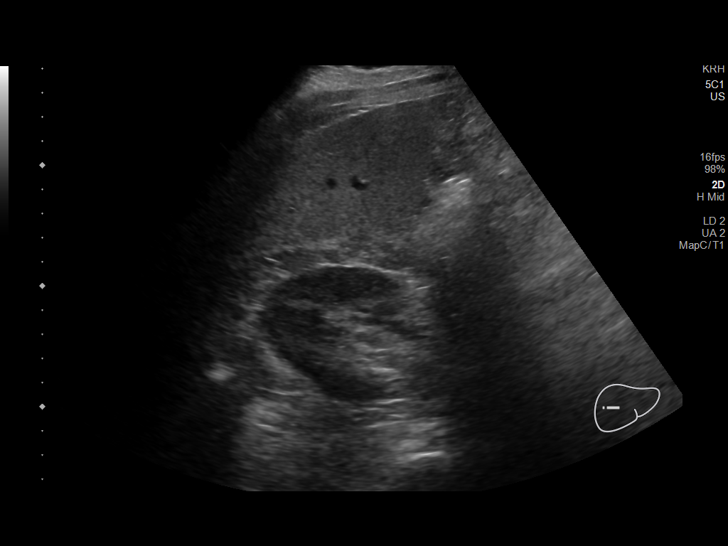
[im 27/33]
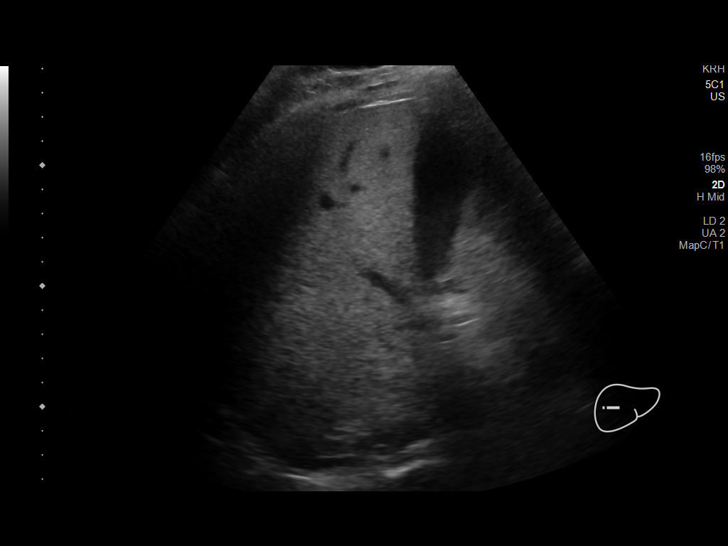
[im 30/33]
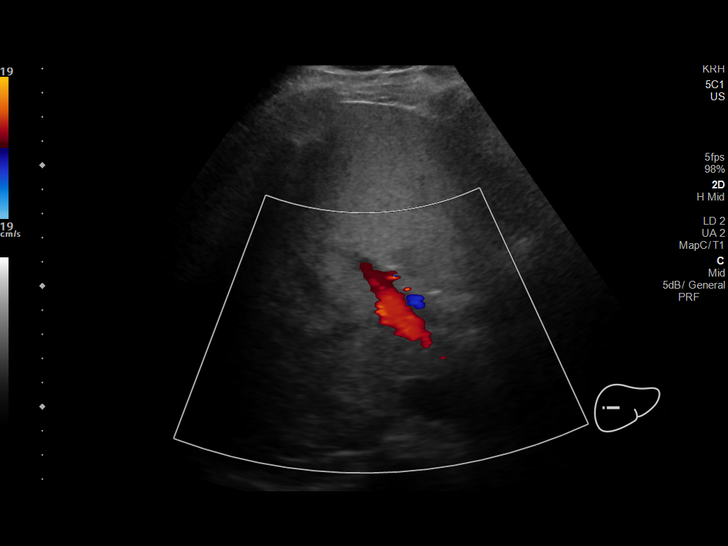
[im 33/33]
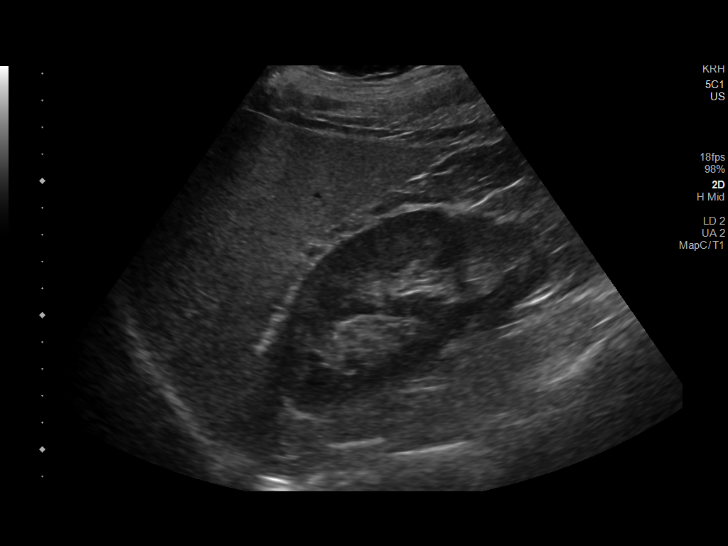

[14 of 25 positions shown; findings below may reference images not displayed]

FINDINGS: Gallbladder:

No gallstones or wall thickening visualized. No sonographic Murphy
sign noted by sonographer.

Common bile duct:

Diameter: 2 mm

Liver:

Echogenic liver. No focal lesion is seen. Portal vein is patent on
color Doppler imaging with normal direction of blood flow towards
the liver.
IMPRESSION: 1. Negative gallbladder.
2. Hepatic steatosis.

## 2022-03-28 ENCOUNTER — Other Ambulatory Visit: Payer: Self-pay | Admitting: Emergency Medicine

## 2022-03-28 DIAGNOSIS — E782 Mixed hyperlipidemia: Secondary | ICD-10-CM

## 2022-06-30 ENCOUNTER — Other Ambulatory Visit: Payer: Self-pay | Admitting: Emergency Medicine

## 2022-06-30 DIAGNOSIS — F411 Generalized anxiety disorder: Secondary | ICD-10-CM

## 2022-06-30 DIAGNOSIS — I10 Essential (primary) hypertension: Secondary | ICD-10-CM

## 2022-07-08 DIAGNOSIS — G4733 Obstructive sleep apnea (adult) (pediatric): Secondary | ICD-10-CM | POA: Diagnosis not present

## 2022-07-11 ENCOUNTER — Other Ambulatory Visit: Payer: Self-pay | Admitting: Emergency Medicine

## 2022-07-12 NOTE — Telephone Encounter (Signed)
Patient is requesting a refill of the following medications: Requested Prescriptions   Pending Prescriptions Disp Refills   ALPRAZolam (XANAX) 0.5 MG tablet [Pharmacy Med Name: ALPRAZOLAM 0.5 MG TABLET] 20 tablet     Sig: TAKE 1 TABLET BY MOUTH EVERY DAY AS NEEDED FOR ANXIETY    Date of patient request: 07/11/2022 Last office visit: 10/06/2021 Date of last refill: 06/15/2021 Last refill amount: 20 tabs Follow up time period per chart: no f/u appt on file

## 2022-08-08 DIAGNOSIS — G4733 Obstructive sleep apnea (adult) (pediatric): Secondary | ICD-10-CM | POA: Diagnosis not present

## 2022-09-07 DIAGNOSIS — G4733 Obstructive sleep apnea (adult) (pediatric): Secondary | ICD-10-CM | POA: Diagnosis not present

## 2022-09-19 ENCOUNTER — Other Ambulatory Visit: Payer: Self-pay | Admitting: Emergency Medicine

## 2022-09-19 DIAGNOSIS — K2270 Barrett's esophagus without dysplasia: Secondary | ICD-10-CM

## 2022-10-08 DIAGNOSIS — G4733 Obstructive sleep apnea (adult) (pediatric): Secondary | ICD-10-CM | POA: Diagnosis not present

## 2022-12-06 ENCOUNTER — Encounter: Payer: Self-pay | Admitting: Emergency Medicine

## 2022-12-06 ENCOUNTER — Ambulatory Visit (INDEPENDENT_AMBULATORY_CARE_PROVIDER_SITE_OTHER): Payer: 59 | Admitting: Emergency Medicine

## 2022-12-06 VITALS — BP 146/92 | HR 76 | Temp 98.3°F | Ht 69.0 in | Wt 227.4 lb

## 2022-12-06 DIAGNOSIS — Z8042 Family history of malignant neoplasm of prostate: Secondary | ICD-10-CM

## 2022-12-06 DIAGNOSIS — F411 Generalized anxiety disorder: Secondary | ICD-10-CM | POA: Diagnosis not present

## 2022-12-06 DIAGNOSIS — K219 Gastro-esophageal reflux disease without esophagitis: Secondary | ICD-10-CM | POA: Diagnosis not present

## 2022-12-06 DIAGNOSIS — Z23 Encounter for immunization: Secondary | ICD-10-CM

## 2022-12-06 DIAGNOSIS — Z1211 Encounter for screening for malignant neoplasm of colon: Secondary | ICD-10-CM

## 2022-12-06 DIAGNOSIS — E785 Hyperlipidemia, unspecified: Secondary | ICD-10-CM

## 2022-12-06 DIAGNOSIS — G4733 Obstructive sleep apnea (adult) (pediatric): Secondary | ICD-10-CM

## 2022-12-06 DIAGNOSIS — Z125 Encounter for screening for malignant neoplasm of prostate: Secondary | ICD-10-CM

## 2022-12-06 DIAGNOSIS — Z0001 Encounter for general adult medical examination with abnormal findings: Secondary | ICD-10-CM | POA: Diagnosis not present

## 2022-12-06 DIAGNOSIS — Z131 Encounter for screening for diabetes mellitus: Secondary | ICD-10-CM | POA: Diagnosis not present

## 2022-12-06 DIAGNOSIS — I10 Essential (primary) hypertension: Secondary | ICD-10-CM | POA: Diagnosis not present

## 2022-12-06 DIAGNOSIS — Z13220 Encounter for screening for lipoid disorders: Secondary | ICD-10-CM

## 2022-12-06 DIAGNOSIS — Z130 Encounter for screening for diseases of the blood and blood-forming organs and certain disorders involving the immune mechanism: Secondary | ICD-10-CM

## 2022-12-06 LAB — COMPREHENSIVE METABOLIC PANEL
ALT: 35 U/L (ref 0–53)
AST: 25 U/L (ref 0–37)
Albumin: 4.4 g/dL (ref 3.5–5.2)
Alkaline Phosphatase: 93 U/L (ref 39–117)
BUN: 12 mg/dL (ref 6–23)
CO2: 27 mEq/L (ref 19–32)
Calcium: 9.5 mg/dL (ref 8.4–10.5)
Chloride: 102 mEq/L (ref 96–112)
Creatinine, Ser: 0.75 mg/dL (ref 0.40–1.50)
GFR: 100.8 mL/min (ref >60.00)
Glucose, Bld: 90 mg/dL (ref 70–99)
Potassium: 4.6 mEq/L (ref 3.5–5.1)
Sodium: 139 mEq/L (ref 135–145)
Total Bilirubin: 0.6 mg/dL (ref 0.2–1.2)
Total Protein: 7.1 g/dL (ref 6.0–8.3)

## 2022-12-06 LAB — CBC WITH DIFFERENTIAL/PLATELET
Basophils Absolute: 0.1 10*3/uL (ref 0.0–0.1)
Basophils Relative: 0.8 % (ref 0.0–3.0)
Eosinophils Absolute: 0 10*3/uL (ref 0.0–0.7)
Eosinophils Relative: 0.5 % (ref 0.0–5.0)
HCT: 46.5 % (ref 39.0–52.0)
Hemoglobin: 15.7 g/dL (ref 13.0–17.0)
Lymphocytes Relative: 24.6 % (ref 12.0–46.0)
Lymphs Abs: 2 10*3/uL (ref 0.7–4.0)
MCHC: 33.9 g/dL (ref 30.0–36.0)
MCV: 88.5 fl (ref 78.0–100.0)
Monocytes Absolute: 0.7 10*3/uL (ref 0.1–1.0)
Monocytes Relative: 8.4 % (ref 3.0–12.0)
Neutro Abs: 5.4 10*3/uL (ref 1.4–7.7)
Neutrophils Relative %: 65.7 % (ref 43.0–77.0)
Platelets: 293 10*3/uL (ref 150.0–400.0)
RBC: 5.25 Mil/uL (ref 4.22–5.81)
RDW: 14 % (ref 11.5–15.5)
WBC: 8.3 10*3/uL (ref 4.0–10.5)

## 2022-12-06 LAB — LIPID PANEL
Cholesterol: 164 mg/dL (ref 0–200)
HDL: 44 mg/dL (ref >39.00)
LDL Cholesterol: 85 mg/dL (ref 0–99)
NonHDL: 120.41
Total CHOL/HDL Ratio: 4
Triglycerides: 177 mg/dL — ABNORMAL HIGH (ref 0.0–149.0)
VLDL: 35.4 mg/dL (ref 0.0–40.0)

## 2022-12-06 LAB — PSA: PSA: 1.75 ng/mL (ref 0.10–4.00)

## 2022-12-06 LAB — HEMOGLOBIN A1C: Hgb A1c MFr Bld: 5.9 % (ref 4.6–6.5)

## 2022-12-06 NOTE — Patient Instructions (Signed)
Health Maintenance, Male Adopting a healthy lifestyle and getting preventive care are important in promoting health and wellness. Ask your health care provider about: The right schedule for you to have regular tests and exams. Things you can do on your own to prevent diseases and keep yourself healthy. What should I know about diet, weight, and exercise? Eat a healthy diet  Eat a diet that includes plenty of vegetables, fruits, low-fat dairy products, and lean protein. Do not eat a lot of foods that are high in solid fats, added sugars, or sodium. Maintain a healthy weight Body mass index (BMI) is a measurement that can be used to identify possible weight problems. It estimates body fat based on height and weight. Your health care provider can help determine your BMI and help you achieve or maintain a healthy weight. Get regular exercise Get regular exercise. This is one of the most important things you can do for your health. Most adults should: Exercise for at least 150 minutes each week. The exercise should increase your heart rate and make you sweat (moderate-intensity exercise). Do strengthening exercises at least twice a week. This is in addition to the moderate-intensity exercise. Spend less time sitting. Even light physical activity can be beneficial. Watch cholesterol and blood lipids Have your blood tested for lipids and cholesterol at 57 years of age, then have this test every 5 years. You may need to have your cholesterol levels checked more often if: Your lipid or cholesterol levels are high. You are older than 57 years of age. You are at high risk for heart disease. What should I know about cancer screening? Many types of cancers can be detected early and may often be prevented. Depending on your health history and family history, you may need to have cancer screening at various ages. This may include screening for: Colorectal cancer. Prostate cancer. Skin cancer. Lung  cancer. What should I know about heart disease, diabetes, and high blood pressure? Blood pressure and heart disease High blood pressure causes heart disease and increases the risk of stroke. This is more likely to develop in people who have high blood pressure readings or are overweight. Talk with your health care provider about your target blood pressure readings. Have your blood pressure checked: Every 3-5 years if you are 18-39 years of age. Every year if you are 40 years old or older. If you are between the ages of 65 and 75 and are a current or former smoker, ask your health care provider if you should have a one-time screening for abdominal aortic aneurysm (AAA). Diabetes Have regular diabetes screenings. This checks your fasting blood sugar level. Have the screening done: Once every three years after age 45 if you are at a normal weight and have a low risk for diabetes. More often and at a younger age if you are overweight or have a high risk for diabetes. What should I know about preventing infection? Hepatitis B If you have a higher risk for hepatitis B, you should be screened for this virus. Talk with your health care provider to find out if you are at risk for hepatitis B infection. Hepatitis C Blood testing is recommended for: Everyone born from 1945 through 1965. Anyone with known risk factors for hepatitis C. Sexually transmitted infections (STIs) You should be screened each year for STIs, including gonorrhea and chlamydia, if: You are sexually active and are younger than 57 years of age. You are older than 57 years of age and your   health care provider tells you that you are at risk for this type of infection. Your sexual activity has changed since you were last screened, and you are at increased risk for chlamydia or gonorrhea. Ask your health care provider if you are at risk. Ask your health care provider about whether you are at high risk for HIV. Your health care provider  may recommend a prescription medicine to help prevent HIV infection. If you choose to take medicine to prevent HIV, you should first get tested for HIV. You should then be tested every 3 months for as long as you are taking the medicine. Follow these instructions at home: Alcohol use Do not drink alcohol if your health care provider tells you not to drink. If you drink alcohol: Limit how much you have to 0-2 drinks a day. Know how much alcohol is in your drink. In the [place] one drink equals one 12 oz bottle of beer (355 mL), one 5 oz glass of wine (148 mL), or one 1 oz glass of hard liquor (44 mL). Lifestyle Do not use any products that contain nicotine or tobacco. These products include cigarettes, chewing tobacco, and vaping devices, such as e-cigarettes. If you need help quitting, ask your health care provider. Do not use street drugs. Do not share needles. Ask your health care provider for help if you need support or information about quitting drugs. General instructions Schedule regular health, dental, and eye exams. Stay current with your vaccines. Tell your health care provider if: You often feel depressed. You have ever been abused or do not feel safe at home. Summary Adopting a healthy lifestyle and getting preventive care are important in promoting health and wellness. Follow your health care provider's instructions about healthy diet, exercising, and getting tested or screened for diseases. Follow your health care provider's instructions on monitoring your cholesterol and blood pressure. This information is not intended to replace advice given to you by your health care provider. Make sure you discuss any questions you have with your health care provider. Document Revised: 02/09/2021 Document Reviewed: 02/09/2021 Elsevier Patient Education  2023 [employer]  

## 2022-12-06 NOTE — Assessment & Plan Note (Signed)
History of Barrett's esophagus.  Takes Prilosec 20 mg twice a day

## 2022-12-06 NOTE — Assessment & Plan Note (Signed)
Stable and well-controlled.  Continues Wellbutrin 300 mg daily. Rarely needs alprazolam

## 2022-12-06 NOTE — Progress Notes (Signed)
[name] 57 y.o.   Chief Complaint  Patient presents with   Annual Exam    Patient states his toe nail on his left foot is turning dark, patient has started using the OTC medication, it has help some  Ears clogged     HISTORY OF PRESENT ILLNESS: This is a 57 y.o. male here for annual exam. History of hypertension on lisinopril 10 mg daily Also complaining of toenail fungus Ears occasionally clogged with excess wax No other complaints or medical concerns today BP Readings from Last 3 Encounters:  12/06/22 (!) 150/96  01/05/22 (!) 147/106  10/06/21 (!) 146/80     HPI   Prior to Admission medications   Medication Sig Start Date End Date Taking? Authorizing Provider  ALPRAZolam Duanne Moron) 0.5 MG tablet TAKE 1 TABLET BY MOUTH EVERY DAY AS NEEDED FOR ANXIETY 07/12/22  Yes Yuma Blucher, Ines Bloomer, MD  aspirin EC 81 MG tablet Take 81 mg by mouth daily.   Yes [provider]  buPROPion (WELLBUTRIN XL) 300 MG 24 hr tablet TAKE 1 TABLET BY MOUTH EVERY DAY 06/30/22  Yes Graylon Amory, Ines Bloomer, MD  lisinopril (ZESTRIL) 10 MG tablet TAKE 1 DAILY FOR BLOOD PRESSURE 06/30/22  Yes Emalina Dubreuil, Ines Bloomer, MD  Lysine 1000 MG TABS Take 1,000 mg by mouth daily.   Yes [provider]  Multiple Vitamin (MULTIVITAMIN) tablet Take 1 tablet by mouth daily.   Yes [provider]  Omega-3 Fatty Acids (FISH OIL ULTRA) 1400 MG CAPS Take 1,400 mg by mouth daily.   Yes [provider]  omeprazole (PRILOSEC) 20 MG capsule TAKE 1 CAPSULE (20 MG TOTAL) BY MOUTH 2 (TWO) TIMES DAILY BEFORE A MEAL. 09/20/22  Yes Caleigha Zale, Ines Bloomer, MD  simvastatin (ZOCOR) 20 MG tablet TAKE 1 TABLET BY MOUTH EVERY DAY 03/28/22  Yes Grayling Schranz, Ines Bloomer, MD  zolpidem (AMBIEN) 5 MG tablet Take 1 tablet (5 mg total) by mouth at bedtime as needed. 08/24/21  Yes Horald Pollen, MD  niacin 500 MG tablet Take 1 tablet (500 mg total) by mouth daily with breakfast. 05/11/18   Horald Pollen, MD    Allergies  Allergen Reactions   Thimerosal (Thiomersal) Swelling   Ampicillin Rash    Childhood.   Iodine Rash    Child hood.    Patient Active Problem List   Diagnosis Date Noted   Cervical radiculopathy 10/06/2021   Neuropathic pain 10/06/2021   Excessive daytime sleepiness 09/14/2021   CPAP (continuous positive airway pressure) dependence 09/14/2021   S/P UPPP (uvulopalatopharyngoplasty) 09/14/2021   History of sleep apnea 05/11/2018   Generalized anxiety disorder 05/11/2018   Sleep disturbance 05/11/2018   History of Barrett's esophagus 05/11/2018   Abnormal EKG 11/24/2016   Smoker 11/24/2016   Family history of coronary artery disease 11/24/2016   Hyperlipemia 08/09/2015   Gastroesophageal reflux disease without esophagitis 08/09/2015   Essential hypertension 08/09/2015   HTN (hypertension) 08/22/2012   Anxiety 08/22/2012   Barrett's esophagus without dysplasia 08/22/2012   OSA on CPAP     Past Medical History:  Diagnosis Date   Abdominal pain    Allergy    seasonal   Anxiety    Asthmatic bronchitis    Barrett's esophagus    Constipation    COVID-19    Diverticulitis    ED (erectile dysfunction)    Family history of adverse reaction to anesthesia    mother has hard time coming out of anethesia   Fatigue  GERD (gastroesophageal reflux disease) 12/2007   HLD (hyperlipidemia)    Hypertension    Insomnia    Myalgia    OSA on CPAP    Psoriasis    Scoliosis    Sleep apnea    Bipap   Vertigo     Past Surgical History:  Procedure Laterality Date   NASAL SINUS SURGERY     REFRACTIVE SURGERY     bilateral   UMBILICAL HERNIA REPAIR N/A 09/22/2021   Procedure: LAPAROSCOPIC REPAIR OF UMBILICAL HERNIA WITH INSERTION OF MESH;  Surgeon: Johnathan Hausen, MD;  Location: [facility];  Service: General;  Laterality: N/A;   UPPER GASTROINTESTINAL ENDOSCOPY     UVULECTOMY      Social History   Socioeconomic History   Marital status: Married     Spouse name: Sherrie Mustache   Number of children: 2   Years of education: 16   Highest education level: Not on file  Occupational History   Occupation: Scientist, research (physical sciences)    Employer: [employer]  Tobacco Use   Smoking status: Former    Types: Cigarettes    Quit date: 01/19/2007    Years since quitting: 15.8   Smokeless tobacco: Never  Vaping Use   Vaping Use: Never used  Substance and Sexual Activity   Alcohol use: Not Currently    Comment: occasional   Drug use: No   Sexual activity: Yes    Partners: Female    Birth control/protection: None  Other Topics Concern   Not on file  Social History Narrative   Lives with wife (travelling Marine scientist, gone about half the time) from whom he is separated, but on good terms.  Shares custody of his daughters with his first wife.  His current wife has two sons, one grown, the other lives with his father.   Right handed    Caffeine: 2 cups of coffee a day   Social Determinants of Health   Financial Resource Strain: Not on file  Food Insecurity: Not on file  Transportation Needs: Not on file  Physical Activity: Not on file  Stress: Not on file  Social Connections: Not on file  Intimate Partner Violence: Not on file    Family History  Problem Relation Age of Onset   Glaucoma Mother    Cataracts Mother        complicated surgery   Prostate cancer Father        metastatic   Hypertension Father    Alcohol abuse Father    Heart disease Father    Diabetes Father    Cirrhosis Father    Gout Father    Arthritis Father    Anxiety disorder Sister    Hypertension Sister    Glaucoma Paternal Uncle    Heart disease Paternal Uncle    Alcohol abuse Paternal Uncle    Colon cancer Paternal Uncle    Heart disease Paternal Uncle    Multiple sclerosis Maternal Aunt    Brain cancer Maternal Uncle    Alcohol abuse Maternal Uncle      Review of Systems  Constitutional: Negative.  Negative for chills and fever.  HENT: Negative.   Negative for congestion and sore throat.   Respiratory: Negative.  Negative for cough and shortness of breath.   Cardiovascular: Negative.  Negative for chest pain and palpitations.  Gastrointestinal: Negative.  Negative for abdominal pain, diarrhea, nausea and vomiting.  Genitourinary: Negative.  Negative for dysuria and hematuria.  Skin: Negative.  Negative for rash.  Neurological: Negative.  Negative for dizziness and headaches.  All other systems reviewed and are negative.  Today's Vitals   12/06/22 1356  BP: (!) 150/96  Pulse: 76  Temp: 98.3 F (36.8 C)  TempSrc: Oral  SpO2: 97%  Weight: 227 lb 6 oz (103.1 kg)  Height: '5\' 9"'$  (1.753 m)   Body mass index is 33.58 kg/m.   Physical Exam Vitals reviewed.  Constitutional:      Appearance: Normal appearance.  HENT:     Head: Normocephalic.     Right Ear: Tympanic membrane, ear canal and external ear normal.     Left Ear: Tympanic membrane, ear canal and external ear normal.     Mouth/Throat:     Mouth: Mucous membranes are moist.     Pharynx: Oropharynx is clear.  Eyes:     Extraocular Movements: Extraocular movements intact.     Conjunctiva/sclera: Conjunctivae normal.     Pupils: Pupils are equal, round, and reactive to light.  Cardiovascular:     Rate and Rhythm: Normal rate and regular rhythm.     Pulses: Normal pulses.     Heart sounds: Normal heart sounds.  Pulmonary:     Effort: Pulmonary effort is normal.     Breath sounds: Normal breath sounds.  Abdominal:     General: There is no distension.     Palpations: Abdomen is soft.     Tenderness: There is no abdominal tenderness.  Musculoskeletal:     Cervical back: No tenderness.     Right lower leg: No edema.     Left lower leg: No edema.  Lymphadenopathy:     Cervical: No cervical adenopathy.  Skin:    General: Skin is warm and dry.  Neurological:     General: No focal deficit present.     Mental Status: He is alert and oriented to person, place, and  time.  Psychiatric:        Mood and Affect: Mood normal.        Behavior: Behavior normal.      ASSESSMENT & PLAN: Problem List Items Addressed This Visit       Cardiovascular and Mediastinum   Essential hypertension    Uncontrolled hypertension  Elevated blood pressure readings at home and in the office. Recommend to increase lisinopril to 20 mg daily Dietary approaches to stop hypertension discussed Cardiovascular risk associated with uncontrolled hypertension discussed.      Relevant Orders   Comprehensive metabolic panel   Hemoglobin A1c     Respiratory   OSA on CPAP    Well-controlled on CPAP 6-hour nightly of quality sleep.        Digestive   Gastroesophageal reflux disease without esophagitis (Chronic)    History of Barrett's esophagus.  Takes Prilosec 20 mg twice a day        Other   Dyslipidemia    Stable.  Lipid profile done today. Cardiovascular risks associated with dyslipidemia discussed. Continue simvastatin 20 mg daily. The 10-year ASCVD risk score (Arnett DK, et al., 2019) is: 16.1%   Values used to calculate the score:     Age: 52 years     Sex: Male     Is Non-Hispanic African American: No     Diabetic: Yes     Tobacco smoker: No     Systolic Blood Pressure: Q000111Q mmHg     Is BP treated: Yes     HDL Cholesterol: 42.5 mg/dL     Total Cholesterol: 164 mg/dL  Relevant Orders   Hemoglobin A1c   Lipid panel   Generalized anxiety disorder    Stable and well-controlled.  Continues Wellbutrin 300 mg daily. Rarely needs alprazolam      Other Visit Diagnoses     Encounter for general adult medical examination with abnormal findings    -  Primary   Relevant Orders   CBC with Differential   Comprehensive metabolic panel   Hemoglobin A1c   Lipid panel   PSA(Must document that pt has been informed of limitations of PSA testing.)   Prostate cancer screening       Relevant Orders   PSA(Must document that pt has been informed of  limitations of PSA testing.)   Family history of prostate cancer       Relevant Orders   PSA(Must document that pt has been informed of limitations of PSA testing.)   Colon cancer screening       Relevant Orders   Ambulatory referral to Gastroenterology   Screening for deficiency anemia       Relevant Orders   CBC with Differential   Screening for lipoid disorders       Screening for endocrine, metabolic and immunity disorder       Relevant Orders   Comprehensive metabolic panel   Hemoglobin A1c   Need for vaccination       Relevant Orders   Flu Vaccine QUAD 6+ mos PF IM (Fluarix Quad PF)   Zoster Recombinant (Shingrix )      Modifiable risk factors discussed with patient. Anticipatory guidance according to age provided. The following topics were also discussed: Social Determinants of Health Smoking.  Non-smoker Diet and nutrition and need to decrease amount of daily carbohydrate intake and daily calories and increase amount of plant based protein in his diet Benefits of exercise Cancer screening and need for colon cancer screening with colonoscopy Vaccinations reviewed and recommendations Cardiovascular risk assessment The 10-year ASCVD risk score (Arnett DK, et al., 2019) is: 16.1%   Values used to calculate the score:     Age: 73 years     Sex: Male     Is Non-Hispanic African American: No     Diabetic: Yes     Tobacco smoker: No     Systolic Blood Pressure: Q000111Q mmHg     Is BP treated: Yes     HDL Cholesterol: 42.5 mg/dL     Total Cholesterol: 164 mg/dL Review of multiple medical problems under management Review of all medications and changes made Mental health including depression and anxiety Fall and accident prevention  Patient Instructions  Health Maintenance, Male Adopting a healthy lifestyle and getting preventive care are important in promoting health and wellness. Ask your health care provider about: The right schedule for you to have regular tests and  exams. Things you can do on your own to prevent diseases and keep yourself healthy. What should I know about diet, weight, and exercise? Eat a healthy diet  Eat a diet that includes plenty of vegetables, fruits, low-fat dairy products, and lean protein. Do not eat a lot of foods that are high in solid fats, added sugars, or sodium. Maintain a healthy weight Body mass index (BMI) is a measurement that can be used to identify possible weight problems. It estimates body fat based on height and weight. Your health care provider can help determine your BMI and help you achieve or maintain a healthy weight. Get regular exercise Get regular exercise. This is one of the  most important things you can do for your health. Most adults should: Exercise for at least 150 minutes each week. The exercise should increase your heart rate and make you sweat (moderate-intensity exercise). Do strengthening exercises at least twice a week. This is in addition to the moderate-intensity exercise. Spend less time sitting. Even light physical activity can be beneficial. Watch cholesterol and blood lipids Have your blood tested for lipids and cholesterol at 57 years of age, then have this test every 5 years. You may need to have your cholesterol levels checked more often if: Your lipid or cholesterol levels are high. You are older than 57 years of age. You are at high risk for heart disease. What should I know about cancer screening? Many types of cancers can be detected early and may often be prevented. Depending on your health history and family history, you may need to have cancer screening at various ages. This may include screening for: Colorectal cancer. Prostate cancer. Skin cancer. Lung cancer. What should I know about heart disease, diabetes, and high blood pressure? Blood pressure and heart disease High blood pressure causes heart disease and increases the risk of stroke. This is more likely to develop in  people who have high blood pressure readings or are overweight. Talk with your health care provider about your target blood pressure readings. Have your blood pressure checked: Every 3-5 years if you are 62-58 years of age. Every year if you are 39 years old or older. If you are between the ages of 33 and 35 and are a current or former smoker, ask your health care provider if you should have a one-time screening for abdominal aortic aneurysm (AAA). Diabetes Have regular diabetes screenings. This checks your fasting blood sugar level. Have the screening done: Once every three years after age 63 if you are at a normal weight and have a low risk for diabetes. More often and at a younger age if you are overweight or have a high risk for diabetes. What should I know about preventing infection? Hepatitis B If you have a higher risk for hepatitis B, you should be screened for this virus. Talk with your health care provider to find out if you are at risk for hepatitis B infection. Hepatitis C Blood testing is recommended for: Everyone born from [birth year] through 1965. Anyone with known risk factors for hepatitis C. Sexually transmitted infections (STIs) You should be screened each year for STIs, including gonorrhea and chlamydia, if: You are sexually active and are younger than 57 years of age. You are older than 57 years of age and your health care provider tells you that you are at risk for this type of infection. Your sexual activity has changed since you were last screened, and you are at increased risk for chlamydia or gonorrhea. Ask your health care provider if you are at risk. Ask your health care provider about whether you are at high risk for HIV. Your health care provider may recommend a prescription medicine to help prevent HIV infection. If you choose to take medicine to prevent HIV, you should first get tested for HIV. You should then be tested every 3 months for as long as you are taking the  medicine. Follow these instructions at home: Alcohol use Do not drink alcohol if your health care provider tells you not to drink. If you drink alcohol: Limit how much you have to 0-2 drinks a day. Know how much alcohol is in your drink. In the [place]  one drink equals one 12 oz bottle of beer (355 mL), one 5 oz glass of wine (148 mL), or one 1 oz glass of hard liquor (44 mL). Lifestyle Do not use any products that contain nicotine or tobacco. These products include cigarettes, chewing tobacco, and vaping devices, such as e-cigarettes. If you need help quitting, ask your health care provider. Do not use street drugs. Do not share needles. Ask your health care provider for help if you need support or information about quitting drugs. General instructions Schedule regular health, dental, and eye exams. Stay current with your vaccines. Tell your health care provider if: You often feel depressed. You have ever been abused or do not feel safe at home. Summary Adopting a healthy lifestyle and getting preventive care are important in promoting health and wellness. Follow your health care provider's instructions about healthy diet, exercising, and getting tested or screened for diseases. Follow your health care provider's instructions on monitoring your cholesterol and blood pressure. This information is not intended to replace advice given to you by your health care provider. Make sure you discuss any questions you have with your health care provider. Document Revised: 02/09/2021 Document Reviewed: 02/09/2021 Elsevier Patient Education  [place], MD [facility]

## 2022-12-06 NOTE — Assessment & Plan Note (Signed)
Uncontrolled hypertension  Elevated blood pressure readings at home and in the office. Recommend to increase lisinopril to 20 mg daily Dietary approaches to stop hypertension discussed Cardiovascular risk associated with uncontrolled hypertension discussed.

## 2022-12-06 NOTE — Assessment & Plan Note (Signed)
Well-controlled on CPAP 6-hour nightly of quality sleep.

## 2022-12-06 NOTE — Assessment & Plan Note (Signed)
Stable.  Lipid profile done today. Cardiovascular risks associated with dyslipidemia discussed. Continue simvastatin 20 mg daily. The 10-year ASCVD risk score (Arnett DK, et al., 2019) is: 16.1%   Values used to calculate the score:     Age: 57 years     Sex: Male     Is Non-Hispanic African American: No     Diabetic: Yes     Tobacco smoker: No     Systolic Blood Pressure: Q000111Q mmHg     Is BP treated: Yes     HDL Cholesterol: 42.5 mg/dL     Total Cholesterol: 164 mg/dL

## 2022-12-10 ENCOUNTER — Encounter: Payer: Self-pay | Admitting: Gastroenterology

## 2022-12-17 ENCOUNTER — Encounter: Payer: Self-pay | Admitting: Emergency Medicine

## 2022-12-18 ENCOUNTER — Emergency Department (HOSPITAL_BASED_OUTPATIENT_CLINIC_OR_DEPARTMENT_OTHER)
Admission: EM | Admit: 2022-12-18 | Discharge: 2022-12-18 | Disposition: A | Discharge status: Home / Self Care | Payer: 59 | Attending: Emergency Medicine | Admitting: Emergency Medicine

## 2022-12-18 ENCOUNTER — Emergency Department (HOSPITAL_BASED_OUTPATIENT_CLINIC_OR_DEPARTMENT_OTHER): Payer: 59

## 2022-12-18 ENCOUNTER — Emergency Department (HOSPITAL_BASED_OUTPATIENT_CLINIC_OR_DEPARTMENT_OTHER): Payer: 59 | Admitting: Radiology

## 2022-12-18 ENCOUNTER — Encounter (HOSPITAL_BASED_OUTPATIENT_CLINIC_OR_DEPARTMENT_OTHER): Payer: Self-pay | Admitting: Emergency Medicine

## 2022-12-18 ENCOUNTER — Other Ambulatory Visit: Payer: Self-pay

## 2022-12-18 DIAGNOSIS — W108XXA Fall (on) (from) other stairs and steps, initial encounter: Secondary | ICD-10-CM

## 2022-12-18 DIAGNOSIS — W109XXA Fall (on) (from) unspecified stairs and steps, initial encounter: Secondary | ICD-10-CM | POA: Insufficient documentation

## 2022-12-18 DIAGNOSIS — Z7982 Long term (current) use of aspirin: Secondary | ICD-10-CM | POA: Insufficient documentation

## 2022-12-18 DIAGNOSIS — R0781 Pleurodynia: Secondary | ICD-10-CM | POA: Diagnosis not present

## 2022-12-18 DIAGNOSIS — S5011XA Contusion of right forearm, initial encounter: Secondary | ICD-10-CM | POA: Insufficient documentation

## 2022-12-18 DIAGNOSIS — S2232XA Fracture of one rib, left side, initial encounter for closed fracture: Secondary | ICD-10-CM | POA: Insufficient documentation

## 2022-12-18 DIAGNOSIS — I10 Essential (primary) hypertension: Secondary | ICD-10-CM | POA: Insufficient documentation

## 2022-12-18 DIAGNOSIS — Z79899 Other long term (current) drug therapy: Secondary | ICD-10-CM | POA: Insufficient documentation

## 2022-12-18 LAB — URINALYSIS, ROUTINE W REFLEX MICROSCOPIC
Bilirubin Urine: NEGATIVE
Glucose, UA: NEGATIVE mg/dL
Hgb urine dipstick: NEGATIVE
Leukocytes,Ua: NEGATIVE
Nitrite: NEGATIVE
Protein, ur: NEGATIVE mg/dL
Specific Gravity, Urine: 1.025 (ref 1.005–1.030)
pH: 5.5 (ref 5.0–8.0)

## 2022-12-18 LAB — CBC WITH DIFFERENTIAL/PLATELET
Abs Immature Granulocytes: 0.02 10*3/uL (ref 0.00–0.07)
Basophils Absolute: 0 10*3/uL (ref 0.0–0.1)
Basophils Relative: 0 %
Eosinophils Absolute: 0 10*3/uL (ref 0.0–0.5)
Eosinophils Relative: 0 %
HCT: 43.7 % (ref 39.0–52.0)
Hemoglobin: 15.1 g/dL (ref 13.0–17.0)
Immature Granulocytes: 0 %
Lymphocytes Relative: 24 %
Lymphs Abs: 1.4 10*3/uL (ref 0.7–4.0)
MCH: 29.7 pg (ref 26.0–34.0)
MCHC: 34.6 g/dL (ref 30.0–36.0)
MCV: 85.9 fL (ref 80.0–100.0)
Monocytes Absolute: 0.5 10*3/uL (ref 0.1–1.0)
Monocytes Relative: 9 %
Neutro Abs: 3.7 10*3/uL (ref 1.7–7.7)
Neutrophils Relative %: 67 %
Platelets: 271 10*3/uL (ref 150–400)
RBC: 5.09 MIL/uL (ref 4.22–5.81)
RDW: 13.2 % (ref 11.5–15.5)
WBC: 5.7 10*3/uL (ref 4.0–10.5)
nRBC: 0 % (ref 0.0–0.2)

## 2022-12-18 LAB — COMPREHENSIVE METABOLIC PANEL
ALT: 28 U/L (ref 0–44)
AST: 20 U/L (ref 15–41)
Albumin: 4.5 g/dL (ref 3.5–5.0)
Alkaline Phosphatase: 83 U/L (ref 38–126)
Anion gap: 7 (ref 5–15)
BUN: 14 mg/dL (ref 6–20)
CO2: 25 mmol/L (ref 22–32)
Calcium: 9.5 mg/dL (ref 8.9–10.3)
Chloride: 104 mmol/L (ref 98–111)
Creatinine, Ser: 0.84 mg/dL (ref 0.61–1.24)
GFR, Estimated: >60 mL/min (ref >60)
Glucose, Bld: 117 mg/dL — ABNORMAL HIGH (ref 70–99)
Potassium: 4.2 mmol/L (ref 3.5–5.1)
Sodium: 136 mmol/L (ref 135–145)
Total Bilirubin: 0.6 mg/dL (ref 0.3–1.2)
Total Protein: 7.2 g/dL (ref 6.5–8.1)

## 2022-12-18 LAB — TROPONIN I (HIGH SENSITIVITY): Troponin I (High Sensitivity): 2 ng/L (ref <18)

## 2022-12-18 MED ORDER — KETOROLAC TROMETHAMINE 15 MG/ML IJ SOLN
15.0000 mg | Freq: Once | INTRAMUSCULAR | Status: AC
  Administered 2022-12-18: 15 mg via INTRAVENOUS
  Filled 2022-12-18: qty 1

## 2022-12-18 MED ORDER — LIDOCAINE 5 % EX PTCH
1.0000 | MEDICATED_PATCH | CUTANEOUS | Status: DC
  Administered 2022-12-18: 1 via TRANSDERMAL
  Filled 2022-12-18: qty 1

## 2022-12-18 MED ORDER — OXYCODONE HCL 5 MG PO TABS: 5.0000 mg | ORAL_TABLET | ORAL | 0 refills | Status: DC | PRN

## 2022-12-18 NOTE — ED Provider Notes (Signed)
[place] EMERGENCY DEPARTMENT AT [facility] Provider Note   CSN: [id number] Arrival date & time: 12/18/22  [id number]     History Chief Complaint  Patient presents with   Rib Injury    [name] is a 57 y.o. male with history of GERD, hypertension, anxiety, Barrett's esophagus, presents the emergency room today for evaluation of left-sided rib pain after fall on the stairs.  Four days ago, patient reports that he stepped too far off of a step at the top of his carpeted stairs and fell approximately 12-15 steps down.  He denies hitting his head or neck.  Denies any neck pain or belly pain or back pain.  He takes a daily aspirin however denies any headache, blurry vision, or loss of consciousness.  No anticoagulant use.  He reports that he was able to walk well after.  He was not experiencing increased pain until after the fall, worsening today with getting out of bed.  He reports that he was also lifting heavy boxes and moving around things yesterday.  He is mainly having pain in his left lower ribs.  No shortness of breath but does have pain with deep breaths over to the left side.  No fevers or recent cough or cold symptoms.  Denies any chest pain, nausea, vomiting.  Denies any other extremity pain.  He has not taken any pain medication for symptoms.  HPI     Home Medications Prior to Admission medications   Medication Sig Start Date End Date Taking? Authorizing Provider  oxyCODONE (ROXICODONE) 5 MG immediate release tablet Take 1 tablet (5 mg total) by mouth every 4 (four) hours as needed for severe pain. 12/18/22  Yes Sherrell Puller, PA-C  ALPRAZolam Duanne Moron) 0.5 MG tablet TAKE 1 TABLET BY MOUTH EVERY DAY AS NEEDED FOR ANXIETY 07/12/22   Horald Pollen, MD  aspirin EC 81 MG tablet Take 81 mg by mouth daily.    [provider]  buPROPion (WELLBUTRIN XL) 300 MG 24 hr tablet TAKE 1 TABLET BY MOUTH EVERY DAY 06/30/22   Horald Pollen, MD  lisinopril  (ZESTRIL) 10 MG tablet TAKE 1 DAILY FOR BLOOD PRESSURE 06/30/22   Horald Pollen, MD  Lysine 1000 MG TABS Take 1,000 mg by mouth daily.    [provider]  Multiple Vitamin (MULTIVITAMIN) tablet Take 1 tablet by mouth daily.    [provider]  niacin 500 MG tablet Take 1 tablet (500 mg total) by mouth daily with breakfast. 05/11/18   Horald Pollen, MD  Omega-3 Fatty Acids (FISH OIL ULTRA) 1400 MG CAPS Take 1,400 mg by mouth daily.    [provider]  omeprazole (PRILOSEC) 20 MG capsule TAKE 1 CAPSULE (20 MG TOTAL) BY MOUTH 2 (TWO) TIMES DAILY BEFORE A MEAL. 09/20/22   Horald Pollen, MD  simvastatin (ZOCOR) 20 MG tablet TAKE 1 TABLET BY MOUTH EVERY DAY 03/28/22   Horald Pollen, MD  zolpidem (AMBIEN) 5 MG tablet Take 1 tablet (5 mg total) by mouth at bedtime as needed. 08/24/21   Horald Pollen, MD      Allergies    Thimerosal (thiomersal), Ampicillin, and Iodine    Review of Systems   Review of Systems  Constitutional:  Negative for chills and fever.  HENT:  Negative for congestion and rhinorrhea.   Eyes:  Negative for photophobia and visual disturbance.  Respiratory:  Negative for shortness of breath.   Cardiovascular:  Reports chest wall pain  Gastrointestinal:  Negative for abdominal pain, constipation, diarrhea, nausea and vomiting.  Genitourinary:  Negative for dysuria and hematuria.  Musculoskeletal:  Negative for arthralgias, back pain and neck pain.  Neurological:  Negative for speech difficulty, weakness, light-headedness and headaches.    Physical Exam Updated Vital Signs BP 126/89   Pulse (!) 58   Temp 98.1 F (36.7 C) (Oral)   Resp 16   SpO2 100%  Physical Exam Constitutional:      General: He is not in acute distress.    Appearance: He is not ill-appearing or toxic-appearing.  HENT:     Head: Normocephalic and atraumatic.     Comments: No step-offs or deformities.  Nontender to palpation.  No  signs of trauma.  No battle signs or raccoon eyes.    Right Ear: Tympanic membrane, ear canal and external ear normal.     Left Ear: Tympanic membrane, ear canal and external ear normal.     Ears:     Comments: No hemotympanums    Nose: Nose normal.     Mouth/Throat:     Mouth: Mucous membranes are moist.     Pharynx: No oropharyngeal exudate or posterior oropharyngeal erythema.  Eyes:     Extraocular Movements: Extraocular movements intact.     Pupils: Pupils are equal, round, and reactive to light.  Neck:     Comments: Full range of motion without pain.  No step-offs or deformities.  Nontender to palpation in both midline and paraspinal area. No overlying skin changes or signs of trauma. Cardiovascular:     Rate and Rhythm: Normal rate.     Pulses: Normal pulses.  Pulmonary:     Effort: Pulmonary effort is normal. No respiratory distress.     Breath sounds: Normal breath sounds.     Comments: Sounds are clear and symmetric bilaterally.  No respiratory distress.  Speaking in full sentences with ease satting well on room air without any increased work of breathing. Chest:     Chest wall: Tenderness present.       Comments: Tenderness to the marked area above.  No step-offs or deformities.  No overlying skin changes noted.  No crepitus, increased erythema, or induration or fluctuance noted. Abdominal:     General: Bowel sounds are normal.     Palpations: Abdomen is soft.     Tenderness: There is no abdominal tenderness. There is no guarding or rebound.     Comments: Nontender to palpation.  No signs of trauma.  No bruising. Soft.   Musculoskeletal:     Cervical back: Normal range of motion. No tenderness.     Comments: Faint bruise noted to the forearm of the right upper extremity.   There is no midline or paraspinal cervical, thoracic, or lumbar tenderness palpation.  There is no bruising, overlying skin changes, or signs of trauma noted to the back.  No step-offs or deformities.   Strength is 5-5 in patient's upper and lower bilateral extremities.  Sensation intact throughout.  Compartments are soft.  Palpable pulses.  Skin:    General: Skin is warm and dry.  Neurological:     General: No focal deficit present.     Mental Status: He is alert.     GCS: GCS eye subscore is 4. GCS verbal subscore is 5. GCS motor subscore is 6.     Cranial Nerves: No cranial nerve deficit, dysarthria or facial asymmetry.     Sensory: No sensory deficit.  Motor: No weakness or pronator drift.     Coordination: Finger-Nose-Finger Test normal.     Comments: GCS 15.  Patient alert.  Cranial nerves II through XII intact.  No facial asymmetry noted.  Patient is answering questions appropriately with appropriate speech.  Sensation intact throughout.  Strength is 5-5 in patient's upper and lower bilateral extremities.  No pronator drift.  Normal finger-nose-finger.     ED Results / Procedures / Treatments   Labs (all labs ordered are listed, but only abnormal results are displayed) Labs Reviewed  COMPREHENSIVE METABOLIC PANEL - Abnormal; Notable for the following components:      Result Value   Glucose, Bld 117 (*)    All other components within normal limits  URINALYSIS, ROUTINE W REFLEX MICROSCOPIC - Abnormal; Notable for the following components:   Ketones, ur TRACE (*)    All other components within normal limits  CBC WITH DIFFERENTIAL/PLATELET  TROPONIN I (HIGH SENSITIVITY)    EKG None  Radiology CT Chest Wo Contrast  Result Date: 12/18/2022 CLINICAL DATA:  Fall down stairs 4 days ago with left-sided rib pain and difficulty breathing. EXAM: CT CHEST WITHOUT CONTRAST TECHNIQUE: Multidetector CT imaging of the chest was performed following the standard protocol without IV contrast. RADIATION DOSE REDUCTION: This exam was performed according to the departmental dose-optimization program which includes automated exposure control, adjustment of the mA and/or kV according to patient  size and/or use of iterative reconstruction technique. COMPARISON:  None Available. FINDINGS: Cardiovascular: Heart is normal size. Calcified plaque over the left anterior descending and right coronary arteries. Thoracic aorta is normal in caliber. Remaining vascular structures are unremarkable. Mediastinum/Nodes: No mediastinal or hilar adenopathy. Remaining mediastinal structures are normal. Lungs/Pleura: Lungs are adequately inflated without airspace consolidation or effusion. No pneumothorax. Airways are normal. Upper Abdomen: No acute findings. Musculoskeletal: Acute nondisplaced posterior left ninth rib fracture. IMPRESSION: 1. No acute cardiopulmonary disease. 2. Acute nondisplaced posterior left ninth rib fracture. 3. Atherosclerotic coronary artery disease. Electronically Signed   By: Marin Olp M.D.   On: 12/18/2022 11:31    Procedures Procedures   Medications Ordered in ED Medications  ketorolac (TORADOL) 15 MG/ML injection 15 mg (15 mg Intravenous Given 12/18/22 1014)    ED Course/ Medical Decision Making/ A&P                            Medical Decision Making Amount and/or Complexity of Data Reviewed Labs: ordered. Radiology: ordered.  Risk Prescription drug management.   57 y.o. male presents to the ER for evaluation of left-sided chest wall pain after fall 4 days ago. Differential diagnosis includes but is not limited to intrathoracic trauma, rib pain, rib contusion, rib fracture. Vital signs mild bradycardia otherwise unremarkable. Physical exam as noted above.   I independently reviewed and interpreted the patient's labs.  Urinalysis shows trace many ketones otherwise unremarkable.  CBC with a leukocytosis or anemia.  CMP shows mildly elevated glucose of 117 otherwise no electrolyte or LFT abnormality.  Troponin at 2.  Chest x-ray shows no acute cardiopulmonary process.  Incision for some rib fractures seen on x-ray.  Called and spoke to radiology who reports that none  are definite seen but recommend CT for further characterization.  Initially, CT with contrast was ordered but given the patient's childhood allergy to iodine with a rash, CT tech did not feel comfortable giving the patient contrast.  Discussed this with my attending recommends we do a  CT without contrast instead.  CT chest shows 1. No acute cardiopulmonary disease. 2. Acute nondisplaced posterior left ninth rib fracture. 3. Atherosclerotic coronary artery disease.   Incentive spirometer was ordered as well as some Toradol and lidocaine.  His rib fractures likely causing his pain.  He does have some bruising to the left forearm however does not have any pain upon palpation and has full range of motion of the elbow.  Compartments are soft.  He reports he does not have x-ray on anything else other than his chest as he is not having any pain elsewhere.  I doubt any other intrathoracic trauma.  Patient has a soft nontender abdomen with no signs of trauma.  He has palpable pulses to his bilateral extremities.  No midline or paraspinal back tenderness.  Lung sounds are equal. Pain is only worsened with movements and deep breaths. His pain is controlled and he is feeling better.   We discussed pain control with Tylenol and ibuprofen and using oxycodone as needed for breakthrough pain.  We discussed using the incentive spirometer and lidocaine patches to avoid atelectasis or pneumonia.  Discussed use this every hour while awake for at least the next few weeks.  Discussed following up with primary care doctor for reevaluation. We discussed strict return precautions and red flag symptoms. The patient verbalized their understanding and agrees to the plan. The patient is stable and being discharged home in good condition.  Portions of this report may have been transcribed using voice recognition software. Every effort was made to ensure accuracy; however, inadvertent computerized transcription errors may be present.    Final Clinical Impression(s) / ED Diagnoses Final diagnoses:  Fall down stairs, initial encounter  Closed fracture of one rib of left side, initial encounter    Rx / DC Orders ED Discharge Orders          Ordered    oxyCODONE (ROXICODONE) 5 MG immediate release tablet  Every 4 hours PRN        12/18/22 1223              Sherrell Puller, PA-C 12/20/22 [place], [place], DO 12/21/22 0830

## 2022-12-18 NOTE — Discharge Instructions (Addendum)
You were seen in the ER today for evaluation of your rib pain after fall.  The CT showed that you do have a fracture to your ninth rib on the left.  This is likely what is causing her pain.  Unfortunately, rib fractures do take a while to heal.  You will most likely be experiencing some pain within the next few weeks.  It is important that you use the incentive spirometer every hour while you are awake to prevent things such as atelectasis or pneumonia.  I recommend topical lidocaine patches as well.  You can also try ibuprofen 600 mg and Tylenol 1000 mg every 6 hours as needed.  I have also prescribed you a small amount of narcotic pain medication.  Please only take when the Tylenol and ibuprofen does not help.  Please do not drive or operate heavy machinery on this medication can make you sleepy.  Please make sure you follow with your primary care doctor for reevaluation.  If you have any concerns, new or worsening symptoms, please return to the nearest emergency department for evaluation.  Contact a doctor if: You have a fever. Get help right away if: You have trouble breathing. You are short of breath. You cannot stop coughing. You cough up thick or bloody spit. You feel like you may vomit (nauseous), vomit, or have belly (abdominal) pain. Your pain gets worse and medicine does not help. These symptoms may be an emergency. Get help right away. Call your local emergency services (911 in the [place] Do not wait to see if the symptoms will go away. Do not drive yourself to the hospital.

## 2022-12-18 NOTE — ED Notes (Signed)
Patient was set up  and educated on incentive spirometry. Patient tolerated well.

## 2022-12-18 NOTE — ED Triage Notes (Addendum)
Pt reports falling and sliding down 12-15 stairs 4 days ago. Pt reports left sided rib pain on the left side that is worse with deep breathing and moving.

## 2022-12-20 ENCOUNTER — Other Ambulatory Visit: Payer: Self-pay | Admitting: Emergency Medicine

## 2022-12-20 DIAGNOSIS — I10 Essential (primary) hypertension: Secondary | ICD-10-CM

## 2022-12-20 MED ORDER — LISINOPRIL 20 MG PO TABS: 20.0000 mg | ORAL_TABLET | Freq: Every day | ORAL | 3 refills | Status: DC

## 2022-12-20 NOTE — Telephone Encounter (Signed)
New prescription for lisinopril 20 sent to pharmacy of record today.  Thanks.

## 2023-01-07 ENCOUNTER — Encounter: Payer: Self-pay | Admitting: Adult Health

## 2023-01-10 ENCOUNTER — Encounter: Payer: Self-pay | Admitting: *Deleted

## 2023-01-11 ENCOUNTER — Encounter: Payer: Self-pay | Admitting: Adult Health

## 2023-01-11 ENCOUNTER — Ambulatory Visit: Payer: 59 | Admitting: Adult Health

## 2023-01-11 VITALS — BP 154/106 | HR 71 | Ht 69.0 in | Wt 224.0 lb

## 2023-01-11 DIAGNOSIS — G4733 Obstructive sleep apnea (adult) (pediatric): Secondary | ICD-10-CM | POA: Diagnosis not present

## 2023-01-11 NOTE — Patient Instructions (Signed)
Continue using CPAP nightly and greater than 4 hours each night °If your symptoms worsen or you develop new symptoms please let us know.  ° °

## 2023-01-11 NOTE — Progress Notes (Signed)
PATIENT: [name] DOB: 1965-10-17  REASON FOR VISIT: follow up HISTORY FROM: patient PRIMARY NEUROLOGIST: Dr. Vickey Hugerohmeier  Chief Complaint  Patient presents with   Follow-up    Pt in 9 Pt here for CPAP f/u Pt states no questions or concerns for today's visit     HISTORY OF PRESENT ILLNESS: Today 01/11/23:  [name] is a 57 y.o. male with a history of obstructive sleep apnea on CPAP. Returns today for follow-up.  He reports that the CPAP is working well.  He denies any new issues.  Continues to find it beneficial.  Does not like to sleep without it       01/05/22: [name] is a 57 year old male with a history of OSA on CPAP. He returns today for follow-up.  He reports that his new CPAP is working well.  He denies any new issues.      REVIEW OF SYSTEMS: Out of a complete 14 system review of symptoms, the patient complains only of the following symptoms, and all other reviewed systems are negative.   ESS  4 FSS 6  ALLERGIES: Allergies  Allergen Reactions   Thimerosal (Thiomersal) Swelling   Ampicillin Rash    Childhood.   Iodine Rash    Child hood.    HOME MEDICATIONS: Outpatient Medications Prior to Visit  Medication Sig Dispense Refill   ALPRAZolam (XANAX) 0.5 MG tablet TAKE 1 TABLET BY MOUTH EVERY DAY AS NEEDED FOR ANXIETY 20 tablet 1   aspirin EC 81 MG tablet Take 81 mg by mouth daily.     buPROPion (WELLBUTRIN XL) 300 MG 24 hr tablet TAKE 1 TABLET BY MOUTH EVERY DAY 90 tablet 3   lisinopril (ZESTRIL) 20 MG tablet Take 1 tablet (20 mg total) by mouth daily. 90 tablet 3   Lysine 1000 MG TABS Take 1,000 mg by mouth daily.     Multiple Vitamin (MULTIVITAMIN) tablet Take 1 tablet by mouth daily.     niacin 500 MG tablet Take 1 tablet (500 mg total) by mouth daily with breakfast. 90 tablet 3   Omega-3 Fatty Acids (FISH OIL ULTRA) 1400 MG CAPS Take 1,400 mg by mouth daily.     omeprazole (PRILOSEC) 20 MG capsule TAKE 1 CAPSULE (20 MG TOTAL)  BY MOUTH 2 (TWO) TIMES DAILY BEFORE A MEAL. 180 capsule 3   simvastatin (ZOCOR) 20 MG tablet TAKE 1 TABLET BY MOUTH EVERY DAY 90 tablet 2   zolpidem (AMBIEN) 5 MG tablet Take 1 tablet (5 mg total) by mouth at bedtime as needed. 30 tablet 2   oxyCODONE (ROXICODONE) 5 MG immediate release tablet Take 1 tablet (5 mg total) by mouth every 4 (four) hours as needed for severe pain. 5 tablet 0   No facility-administered medications prior to visit.    PAST MEDICAL HISTORY: Past Medical History:  Diagnosis Date   Abdominal pain    Allergy    seasonal   Anxiety    Asthmatic bronchitis    Barrett's esophagus    Constipation    COVID-19    Diverticulitis    ED (erectile dysfunction)    Family history of adverse reaction to anesthesia    mother has hard time coming out of anethesia   Fatigue    GERD (gastroesophageal reflux disease) 12/2007   HLD (hyperlipidemia)    Hypertension    Insomnia    Myalgia    OSA on CPAP    Psoriasis    Scoliosis  Sleep apnea    Bipap   Vertigo     PAST SURGICAL HISTORY: Past Surgical History:  Procedure Laterality Date   NASAL SINUS SURGERY     REFRACTIVE SURGERY     bilateral   UMBILICAL HERNIA REPAIR N/A 09/22/2021   Procedure: LAPAROSCOPIC REPAIR OF UMBILICAL HERNIA WITH INSERTION OF MESH;  Surgeon: Luretha Murphy, MD;  Location: [facility];  Service: General;  Laterality: N/A;   UPPER GASTROINTESTINAL ENDOSCOPY     UVULECTOMY      FAMILY HISTORY: Family History  Problem Relation Age of Onset   Glaucoma Mother    Cataracts Mother        complicated surgery   Prostate cancer Father        metastatic   Hypertension Father    Alcohol abuse Father    Heart disease Father    Diabetes Father    Cirrhosis Father    Gout Father    Arthritis Father    Anxiety disorder Sister    Hypertension Sister    Sleep apnea Sister    Multiple sclerosis Maternal Aunt    Brain cancer Maternal Uncle    Alcohol abuse Maternal Uncle    Glaucoma Paternal  Uncle    Heart disease Paternal Uncle    Alcohol abuse Paternal Uncle    Colon cancer Paternal Uncle    Heart disease Paternal Uncle     SOCIAL HISTORY: Social History   Socioeconomic History   Marital status: Married    Spouse name: Gladis Riffle   Number of children: 2   Years of education: 16   Highest education level: Not on file  Occupational History   Occupation: Technical brewer    Employer: [employer]  Tobacco Use   Smoking status: Former    Types: Cigarettes    Quit date: 01/19/2007    Years since quitting: 15.9   Smokeless tobacco: Never  Vaping Use   Vaping Use: Never used  Substance and Sexual Activity   Alcohol use: Not Currently    Alcohol/week: 4.0 standard drinks of alcohol    Types: 2 Glasses of wine, 2 Cans of beer per week    Comment: occasional   Drug use: No   Sexual activity: Yes    Partners: Female    Birth control/protection: None  Other Topics Concern   Not on file  Social History Narrative   Lives with wife (travelling Engineer, civil (consulting), gone about half the time) from whom he is separated, but on good terms.  Shares custody of his daughters with his first wife.  His current wife has two sons, one grown, the other lives with his father.   Right handed    Caffeine: 2 cups of coffee a day   Social Determinants of Health   Financial Resource Strain: Not on file  Food Insecurity: Not on file  Transportation Needs: Not on file  Physical Activity: Not on file  Stress: Not on file  Social Connections: Not on file  Intimate Partner Violence: Not on file      PHYSICAL EXAM  Vitals:   01/11/23 1052  BP: (!) 154/106  Pulse: 71  Weight: 224 lb (101.6 kg)  Height: 5\' 9"  (1.753 m)   Body mass index is 33.08 kg/m.  Generalized: Well developed, in no acute distress  Chest: Lungs clear to auscultation bilaterally  Neurological examination  Mentation: Alert oriented to time, place, history taking. Follows all commands speech and language  fluent Cranial nerve II-XII: Facial symmetry  noted Gait and station: Gait is normal.    DIAGNOSTIC DATA (LABS, IMAGING, TESTING) - I reviewed patient records, labs, notes, testing and imaging myself where available.  Lab Results  Component Value Date   WBC 5.7 12/18/2022   HGB 15.1 12/18/2022   HCT 43.7 12/18/2022   MCV 85.9 12/18/2022   PLT 271 12/18/2022      Component Value Date/Time   NA 136 12/18/2022 1014   NA 140 03/27/2020 1807   K 4.2 12/18/2022 1014   CL 104 12/18/2022 1014   CO2 25 12/18/2022 1014   GLUCOSE 117 (H) 12/18/2022 1014   BUN 14 12/18/2022 1014   BUN 12 03/27/2020 1807   CREATININE 0.84 12/18/2022 1014   CREATININE 0.85 08/09/2015 1033   CALCIUM 9.5 12/18/2022 1014   PROT 7.2 12/18/2022 1014   PROT 7.1 03/27/2020 1807   ALBUMIN 4.5 12/18/2022 1014   ALBUMIN 4.8 03/27/2020 1807   AST 20 12/18/2022 1014   ALT 28 12/18/2022 1014   ALKPHOS 83 12/18/2022 1014   BILITOT 0.6 12/18/2022 1014   BILITOT <0.2 03/27/2020 1807   GFRNONAA >60 12/18/2022 1014   GFRNONAA >89 08/09/2015 1033   GFRAA >60 05/24/2020 1848   GFRAA >89 08/09/2015 1033   Lab Results  Component Value Date   CHOL 164 12/06/2022   HDL 44.00 12/06/2022   LDLCALC 85 12/06/2022   TRIG 177.0 (H) 12/06/2022   CHOLHDL 4 12/06/2022   Lab Results  Component Value Date   HGBA1C 5.9 12/06/2022   No results found for: "VITAMINB12" Lab Results  Component Value Date   TSH 0.908 06/21/2019      ASSESSMENT AND PLAN 57 y.o. year old male  has a past medical history of Abdominal pain, Allergy, Anxiety, Asthmatic bronchitis, Barrett's esophagus, Constipation, COVID-19, Diverticulitis, ED (erectile dysfunction), Family history of adverse reaction to anesthesia, Fatigue, GERD (gastroesophageal reflux disease) (12/2007), HLD (hyperlipidemia), Hypertension, Insomnia, Myalgia, OSA on CPAP, Psoriasis, Scoliosis, Sleep apnea, and Vertigo. here with:  OSA on CPAP  - CPAP compliance excellent -  Good treatment of AHI  - Encourage patient to use CPAP nightly and > 4 hours each night - F/U in 1 year or sooner if needed  .  Butch Penny, MSN, NP-C 01/11/2023, 11:05 AM [facility] [address] [place], [place] [address] [phone]

## 2023-01-18 ENCOUNTER — Ambulatory Visit (AMBULATORY_SURGERY_CENTER): Payer: 59

## 2023-01-18 VITALS — Ht 69.0 in | Wt 218.0 lb

## 2023-01-18 DIAGNOSIS — Z1211 Encounter for screening for malignant neoplasm of colon: Secondary | ICD-10-CM

## 2023-01-18 DIAGNOSIS — K2270 Barrett's esophagus without dysplasia: Secondary | ICD-10-CM

## 2023-01-18 MED ORDER — NA SULFATE-K SULFATE-MG SULF 17.5-3.13-1.6 GM/177ML PO SOLN: 1.0000 | Freq: Once | ORAL | 0 refills | Status: AC

## 2023-01-18 NOTE — Progress Notes (Signed)
No egg or soy allergy known to patient  No issues known to pt with past sedation with any surgeries or procedures Patient denies ever being told they had issues or difficulty with intubation  No FH of Malignant Hyperthermia Pt is not on diet pills Pt is not on  home 02  Pt is not on blood thinners  Pt denies issues with constipation  No A fib or A flutter Have any cardiac testing pending--no Pt instructed to use [email or web address] or GoodRx for a price reduction on prep   

## 2023-02-01 ENCOUNTER — Encounter: Payer: Self-pay | Admitting: Gastroenterology

## 2023-02-07 ENCOUNTER — Encounter: Payer: Self-pay | Admitting: Certified Registered Nurse Anesthetist

## 2023-02-11 ENCOUNTER — Ambulatory Visit (AMBULATORY_SURGERY_CENTER): Payer: 59 | Admitting: Gastroenterology

## 2023-02-11 ENCOUNTER — Encounter: Payer: Self-pay | Admitting: Gastroenterology

## 2023-02-11 VITALS — BP 124/87 | HR 69 | Temp 98.6°F | Resp 14 | Ht 69.0 in | Wt 218.0 lb

## 2023-02-11 DIAGNOSIS — D125 Benign neoplasm of sigmoid colon: Secondary | ICD-10-CM | POA: Diagnosis not present

## 2023-02-11 DIAGNOSIS — G4733 Obstructive sleep apnea (adult) (pediatric): Secondary | ICD-10-CM | POA: Diagnosis not present

## 2023-02-11 DIAGNOSIS — D123 Benign neoplasm of transverse colon: Secondary | ICD-10-CM

## 2023-02-11 DIAGNOSIS — Z1211 Encounter for screening for malignant neoplasm of colon: Secondary | ICD-10-CM

## 2023-02-11 DIAGNOSIS — D120 Benign neoplasm of cecum: Secondary | ICD-10-CM | POA: Diagnosis not present

## 2023-02-11 DIAGNOSIS — K2270 Barrett's esophagus without dysplasia: Secondary | ICD-10-CM

## 2023-02-11 DIAGNOSIS — K635 Polyp of colon: Secondary | ICD-10-CM | POA: Diagnosis not present

## 2023-02-11 DIAGNOSIS — K2950 Unspecified chronic gastritis without bleeding: Secondary | ICD-10-CM | POA: Diagnosis not present

## 2023-02-11 DIAGNOSIS — I10 Essential (primary) hypertension: Secondary | ICD-10-CM | POA: Diagnosis not present

## 2023-02-11 MED ORDER — SODIUM CHLORIDE 0.9 % IV SOLN: 500.0000 mL | Freq: Once | INTRAVENOUS | Status: DC

## 2023-02-11 NOTE — Progress Notes (Signed)
Report given to PACU, vss 

## 2023-02-11 NOTE — Progress Notes (Signed)
Called to room to assist during endoscopic procedure.  Patient ID and intended procedure confirmed with present staff. Received instructions for my participation in the procedure from the performing physician.  

## 2023-02-11 NOTE — Progress Notes (Signed)
0759 Robinul 0.1 mg IV given due large amount of secretions upon assessment.  MD made aware, vss 

## 2023-02-11 NOTE — Progress Notes (Signed)
History & Physical  Primary Care Physician:  Georgina Quint, MD Primary Gastroenterologist: Claudette Head, MD  Impression / Plan:  Average risk CRC screening and Barrett's esophagus for colonoscopy and EGD.  CHIEF COMPLAINT:  CRC screening, Barrett's esophagus  HPI: [name] is a 57 y.o. male average risk CRC screening and Barrett's esophagus for colonoscopy and EGD.   Past Medical History:  Diagnosis Date   Abdominal pain    Allergy    seasonal   Anxiety    Barrett's esophagus    Constipation    COVID-19    Diverticulitis    ED (erectile dysfunction)    Family history of adverse reaction to anesthesia    mother has hard time coming out of anethesia   Fatigue    GERD (gastroesophageal reflux disease) 12/2007   HLD (hyperlipidemia)    Hypertension    Insomnia    Myalgia    OSA on CPAP    Psoriasis    Scoliosis    Sleep apnea    Bipap   Vertigo     Past Surgical History:  Procedure Laterality Date   NASAL SINUS SURGERY     REFRACTIVE SURGERY     bilateral   UMBILICAL HERNIA REPAIR N/A 09/22/2021   Procedure: LAPAROSCOPIC REPAIR OF UMBILICAL HERNIA WITH INSERTION OF MESH;  Surgeon: Luretha Murphy, MD;  Location: [facility];  Service: General;  Laterality: N/A;   UPPER GASTROINTESTINAL ENDOSCOPY     UVULECTOMY      Prior to Admission medications   Medication Sig Start Date End Date Taking? Authorizing Provider  aspirin EC 81 MG tablet Take 81 mg by mouth daily.   Yes [provider]  buPROPion (WELLBUTRIN XL) 300 MG 24 hr tablet TAKE 1 TABLET BY MOUTH EVERY DAY 06/30/22  Yes Sagardia, Eilleen Kempf, MD  lisinopril (ZESTRIL) 20 MG tablet Take 1 tablet (20 mg total) by mouth daily. 12/20/22  Yes Sagardia, Eilleen Kempf, MD  Lysine 1000 MG TABS Take 1,000 mg by mouth daily.   Yes [provider]  Multiple Vitamin (MULTIVITAMIN) tablet Take 1 tablet by mouth daily.   Yes [provider]  Omega-3 Fatty Acids (FISH OIL ULTRA)  1400 MG CAPS Take 1,400 mg by mouth daily.   Yes [provider]  omeprazole (PRILOSEC) 20 MG capsule TAKE 1 CAPSULE (20 MG TOTAL) BY MOUTH 2 (TWO) TIMES DAILY BEFORE A MEAL. 09/20/22  Yes Georgina Quint, MD  simvastatin (ZOCOR) 20 MG tablet TAKE 1 TABLET BY MOUTH EVERY DAY 03/28/22  Yes Sagardia, Eilleen Kempf, MD  ALPRAZolam Prudy Feeler) 0.5 MG tablet TAKE 1 TABLET BY MOUTH EVERY DAY AS NEEDED FOR ANXIETY 07/12/22   Georgina Quint, MD  niacin 500 MG tablet Take 1 tablet (500 mg total) by mouth daily with breakfast. Patient not taking: Reported on 01/18/2023 05/11/18   Georgina Quint, MD  zolpidem (AMBIEN) 5 MG tablet Take 1 tablet (5 mg total) by mouth at bedtime as needed. Patient not taking: Reported on 01/18/2023 08/24/21   Georgina Quint, MD    Current Outpatient Medications  Medication Sig Dispense Refill   aspirin EC 81 MG tablet Take 81 mg by mouth daily.     buPROPion (WELLBUTRIN XL) 300 MG 24 hr tablet TAKE 1 TABLET BY MOUTH EVERY DAY 90 tablet 3   lisinopril (ZESTRIL) 20 MG tablet Take 1 tablet (20 mg total) by mouth daily. 90 tablet 3   Lysine 1000 MG TABS Take 1,000 mg  by mouth daily.     Multiple Vitamin (MULTIVITAMIN) tablet Take 1 tablet by mouth daily.     Omega-3 Fatty Acids (FISH OIL ULTRA) 1400 MG CAPS Take 1,400 mg by mouth daily.     omeprazole (PRILOSEC) 20 MG capsule TAKE 1 CAPSULE (20 MG TOTAL) BY MOUTH 2 (TWO) TIMES DAILY BEFORE A MEAL. 180 capsule 3   simvastatin (ZOCOR) 20 MG tablet TAKE 1 TABLET BY MOUTH EVERY DAY 90 tablet 2   ALPRAZolam (XANAX) 0.5 MG tablet TAKE 1 TABLET BY MOUTH EVERY DAY AS NEEDED FOR ANXIETY 20 tablet 1   niacin 500 MG tablet Take 1 tablet (500 mg total) by mouth daily with breakfast. (Patient not taking: Reported on 01/18/2023) 90 tablet 3   zolpidem (AMBIEN) 5 MG tablet Take 1 tablet (5 mg total) by mouth at bedtime as needed. (Patient not taking: Reported on 01/18/2023) 30 tablet 2   Current Facility-Administered  Medications  Medication Dose Route Frequency Provider Last Rate Last Admin   0.9 %  sodium chloride infusion  500 mL Intravenous Once Meryl Dare, MD        Allergies as of 02/11/2023 - Review Complete 02/11/2023  Allergen Reaction Noted   Thimerosal (thiomersal) Swelling 01/19/2012   Ampicillin Rash 01/19/2012   Iodine Rash 01/19/2012    Family History  Problem Relation Age of Onset   Glaucoma Mother    Cataracts Mother        complicated surgery   Colon polyps Father    Prostate cancer Father        metastatic   Hypertension Father    Alcohol abuse Father    Heart disease Father    Diabetes Father    Cirrhosis Father    Gout Father    Arthritis Father    Anxiety disorder Sister    Hypertension Sister    Sleep apnea Sister    Multiple sclerosis Maternal Aunt    Brain cancer Maternal Uncle    Alcohol abuse Maternal Uncle    Glaucoma Paternal Uncle    Heart disease Paternal Uncle    Alcohol abuse Paternal Uncle    Colon cancer Paternal Uncle    Heart disease Paternal Uncle    Esophageal cancer Neg Hx    Rectal cancer Neg Hx    Stomach cancer Neg Hx     Social History   Socioeconomic History   Marital status: Married    Spouse name: Gladis Riffle   Number of children: 2   Years of education: 16   Highest education level: Not on file  Occupational History   Occupation: Technical brewer    Employer: [employer]  Tobacco Use   Smoking status: Former    Types: Cigarettes    Quit date: 01/19/2007    Years since quitting: 16.0   Smokeless tobacco: Never  Vaping Use   Vaping Use: Never used  Substance and Sexual Activity   Alcohol use: Not Currently    Alcohol/week: 4.0 standard drinks of alcohol    Types: 2 Glasses of wine, 2 Cans of beer per week    Comment: occasional   Drug use: No   Sexual activity: Yes    Partners: Female    Birth control/protection: None  Other Topics Concern   Not on file  Social History Narrative   Lives with  wife (travelling Engineer, civil (consulting), gone about half the time) from whom he is separated, but on good terms.  Shares custody of his daughters with his  first wife.  His current wife has two sons, one grown, the other lives with his father.   Right handed    Caffeine: 2 cups of coffee a day   Social Determinants of Health   Financial Resource Strain: Not on file  Food Insecurity: Not on file  Transportation Needs: Not on file  Physical Activity: Not on file  Stress: Not on file  Social Connections: Not on file  Intimate Partner Violence: Not on file    Review of Systems:  All systems reviewed were negative except where noted in HPI.   Physical Exam:  General:  Alert, well-developed, in NAD Head:  Normocephalic and atraumatic. Eyes:  Sclera clear, no icterus.   Conjunctiva pink. Ears:  Normal auditory acuity. Mouth:  No deformity or lesions.  Neck:  Supple; no masses. Lungs:  Clear throughout to auscultation.   No wheezes, crackles, or rhonchi.  Heart:  Regular rate and rhythm; no murmurs. Abdomen:  Soft, nondistended, nontender. No masses, hepatomegaly. No palpable masses.  Normal bowel sounds.    Rectal:  Deferred   Msk:  Symmetrical without gross deformities. Extremities:  Without edema. Neurologic:  Alert and  oriented x 4; grossly normal neurologically. Skin:  Intact without significant lesions or rashes. Psych:  Alert and cooperative. Normal mood and affect.   Venita Lick. Russella Dar  02/11/2023, 8:08 AM See Loretha Stapler, [facility], to contact our on call provider

## 2023-02-11 NOTE — Op Note (Signed)
[facility] Patient Name: [name] Procedure Date: 02/11/2023 7:59 AM MRN: [id number] Endoscopist: Meryl Dare , MD, [id number] Age: 57 Referring MD:  Date of Birth: July 21, 1966 Gender: Male Account #: [id number] Procedure:                Colonoscopy Indications:              Screening for colorectal malignant neoplasm Medicines:                Monitored Anesthesia Care Procedure:                Pre-Anesthesia Assessment:                           - Prior to the procedure, a History and Physical                            was performed, and patient medications and                            allergies were reviewed. The patient's tolerance of                            previous anesthesia was also reviewed. The risks                            and benefits of the procedure and the sedation                            options and risks were discussed with the patient.                            All questions were answered, and informed consent                            was obtained. Prior Anticoagulants: The patient has                            taken no anticoagulant or antiplatelet agents. ASA                            Grade Assessment: II - A patient with mild systemic                            disease. After reviewing the risks and benefits,                            the patient was deemed in satisfactory condition to                            undergo the procedure.                           After obtaining informed consent, the colonoscope  was passed under direct vision. Throughout the                            procedure, the patient's blood pressure, pulse, and                            oxygen saturations were monitored continuously. The                            CF [id number] #[id number] was introduced through the anus                            and advanced to the the cecum, identified by                            appendiceal  orifice and ileocecal valve. The                            ileocecal valve, appendiceal orifice, and rectum                            were photographed. The quality of the bowel                            preparation was good. The colonoscopy was performed                            without difficulty. The patient tolerated the                            procedure well. Scope In: 8:12:08 AM Scope Out: 8:29:40 AM Scope Withdrawal Time: 0 hours 15 minutes 57 seconds  Total Procedure Duration: 0 hours 17 minutes 32 seconds  Findings:                 The perianal and digital rectal examinations were                            normal.                           Six sessile polyps were found in the sigmoid colon                            (3), transverse colon (2) and cecum (1). The polyps                            were 5 to 8 mm in size. These polyps were removed                            with a cold snare. Resection and retrieval were                            complete.  The exam was otherwise without abnormality on                            direct and retroflexion views. Complications:            No immediate complications. Estimated blood loss:                            None. Estimated Blood Loss:     Estimated blood loss: none. Impression:               - Six 5 to 8 mm polyps in the sigmoid colon, in the                            transverse colon and in the cecum, removed with a                            cold snare. Resected and retrieved.                           - The examination was otherwise normal on direct                            and retroflexion views. Recommendation:           - Repeat colonoscopy, likely 3 years, date to be                            determined after pending pathology results are                            reviewed for surveillance based on pathology                            results.                           - Patient has  a contact number available for                            emergencies. The signs and symptoms of potential                            delayed complications were discussed with the                            patient. Return to normal activities tomorrow.                            Written discharge instructions were provided to the                            patient.                           - Resume previous diet.                           -  Continue present medications.                           - Await pathology results. Meryl Dare, MD 02/11/2023 8:33:04 AM This report has been signed electronically.

## 2023-02-11 NOTE — Op Note (Signed)
[facility] Patient Name: [name] Procedure Date: 02/11/2023 7:58 AM MRN: [id number] Endoscopist: Meryl Dare , MD, [phone] Age: 57 Referring MD:  Date of Birth: Feb 17, 1966 Gender: Male Account #: [id number] Procedure:                Upper GI endoscopy Indications:              Surveillance for malignancy due to personal history                            of Barrett's esophagus Medicines:                Monitored Anesthesia Care Procedure:                Pre-Anesthesia Assessment:                           - Prior to the procedure, a History and Physical                            was performed, and patient medications and                            allergies were reviewed. The patient's tolerance of                            previous anesthesia was also reviewed. The risks                            and benefits of the procedure and the sedation                            options and risks were discussed with the patient.                            All questions were answered, and informed consent                            was obtained. Prior Anticoagulants: The patient has                            taken no anticoagulant or antiplatelet agents. ASA                            Grade Assessment: II - A patient with mild systemic                            disease. After reviewing the risks and benefits,                            the patient was deemed in satisfactory condition to                            undergo the procedure.  After obtaining informed consent, the endoscope was                            passed under direct vision. Throughout the                            procedure, the patient's blood pressure, pulse, and                            oxygen saturations were monitored continuously. The                            GIF HQ190 #[id number] was introduced through the                            mouth, and advanced to the  second part of duodenum.                            The upper GI endoscopy was accomplished without                            difficulty. The patient tolerated the procedure                            well. Scope In: Scope Out: Findings:                 There were esophageal mucosal changes secondary to                            established short-segment Barrett's disease present                            in the distal esophagus. The maximum longitudinal                            extent of these mucosal changes was 1 cm in length.                            Mucosa was biopsied with a cold forceps for                            histology. One specimen bottle was sent to                            pathology.                           The exam of the esophagus was otherwise normal.                           A small hiatal hernia was present.                           The exam of the stomach was otherwise  normal.                           The duodenal bulb and second portion of the                            duodenum were normal. Complications:            No immediate complications. Estimated Blood Loss:     Estimated blood loss was minimal. Impression:               - Esophageal mucosal changes secondary to                            established short-segment Barrett's disease.                            Biopsied.                           - Small hiatal hernia.                           - Normal duodenal bulb and second portion of the                            duodenum. Recommendation:           - Patient has a contact number available for                            emergencies. The signs and symptoms of potential                            delayed complications were discussed with the                            patient. Return to normal activities tomorrow.                            Written discharge instructions were provided to the                            patient.                            - Resume previous diet.                           - Follow antireflux measures.                           - Continue present medications.                           - Await pathology results.                           - Repeat upper endoscopy after studies are  complete                            for surveillance of Barrett's esophagus. Meryl Dare, MD 02/11/2023 8:44:53 AM This report has been signed electronically.

## 2023-02-11 NOTE — Patient Instructions (Addendum)
Recommendation:Patient has a contact number available for                            emergencies. The signs and symptoms of potential                            delayed complications were discussed with the                            patient. Return to normal activities tomorrow.                            Written discharge instructions were provided to the                            patient.                           - Resume previous diet.                           - Follow antireflux measures.                           - Continue present medications.                           - Await pathology results.                           - Repeat upper endoscopy after studies are complete                            for surveillance of Barrett's esophagus.  Handouts on polyps and reflux given.  YOU HAD AN ENDOSCOPIC PROCEDURE TODAY AT THE [facility]:   Refer to the procedure report that was given to you for any specific questions about what was found during the examination.  If the procedure report does not answer your questions, please call your gastroenterologist to clarify.  If you requested that your care partner not be given the details of your procedure findings, then the procedure report has been included in a sealed envelope for you to review at your convenience later.  YOU SHOULD EXPECT: Some feelings of bloating in the abdomen. Passage of more gas than usual.  Walking can help get rid of the air that was put into your GI tract during the procedure and reduce the bloating. If you had a lower endoscopy (such as a colonoscopy or flexible sigmoidoscopy) you may notice spotting of blood in your stool or on the toilet paper. If you underwent a bowel prep for your procedure, you may not have a normal bowel movement for a few days.  Please Note:  You might notice some irritation and congestion in your nose or some drainage.  This is from the oxygen used during your procedure.  There is no  need for concern and it should clear up in a day or so.  SYMPTOMS TO REPORT IMMEDIATELY:  Following lower endoscopy (colonoscopy or flexible sigmoidoscopy):  Excessive amounts of blood  in the stool  Significant tenderness or worsening of abdominal pains  Swelling of the abdomen that is new, acute  Fever of 100F or higher  Following upper endoscopy (EGD)  Vomiting of blood or coffee ground material  New chest pain or pain under the shoulder blades  Painful or persistently difficult swallowing  New shortness of breath  Fever of 100F or higher  Black, tarry-looking stools  For urgent or emergent issues, a gastroenterologist can be reached at any hour by calling [phone]. Do not use MyChart messaging for urgent concerns.   DIET:  We do recommend a small meal at first, but then you may proceed to your regular diet.  Drink plenty of fluids but you should avoid alcoholic beverages for 24 hours.  ACTIVITY:  You should plan to take it easy for the rest of today and you should NOT DRIVE or use heavy machinery until tomorrow (because of the sedation medicines used during the test).    FOLLOW UP: Our staff will call the number listed on your records the next business day following your procedure.  We will call around 7:15- 8:00 am to check on you and address any questions or concerns that you may have regarding the information given to you following your procedure. If we do not reach you, we will leave a message.     If any biopsies were taken you will be contacted by phone or by letter within the next 1-3 weeks.  Please call us at [phone] if you have not heard about the biopsies in 3 weeks.   SIGNATURES/CONFIDENTIALITY: You and/or your care partner have signed paperwork which will be entered into your electronic medical record.  These signatures attest to the fact that that the information above on your After Visit Summary has been reviewed and is understood.  Full responsibility  of the confidentiality of this discharge information lies with you and/or your care-partner.

## 2023-02-14 ENCOUNTER — Telehealth: Payer: Self-pay | Admitting: *Deleted

## 2023-02-14 NOTE — Telephone Encounter (Signed)
No answer for post procedure call back. Left VM. 

## 2023-03-01 ENCOUNTER — Encounter: Payer: Self-pay | Admitting: Gastroenterology

## 2023-04-10 ENCOUNTER — Other Ambulatory Visit: Payer: Self-pay | Admitting: Emergency Medicine

## 2023-04-10 DIAGNOSIS — E782 Mixed hyperlipidemia: Secondary | ICD-10-CM

## 2023-05-04 DIAGNOSIS — L821 Other seborrheic keratosis: Secondary | ICD-10-CM | POA: Diagnosis not present

## 2023-05-04 DIAGNOSIS — L820 Inflamed seborrheic keratosis: Secondary | ICD-10-CM | POA: Diagnosis not present

## 2023-05-04 DIAGNOSIS — D485 Neoplasm of uncertain behavior of skin: Secondary | ICD-10-CM | POA: Diagnosis not present

## 2023-05-19 ENCOUNTER — Encounter (INDEPENDENT_AMBULATORY_CARE_PROVIDER_SITE_OTHER): Payer: Self-pay

## 2023-06-08 ENCOUNTER — Ambulatory Visit: Payer: 59 | Admitting: Emergency Medicine

## 2023-06-29 ENCOUNTER — Encounter: Payer: Self-pay | Admitting: Emergency Medicine

## 2023-06-29 ENCOUNTER — Ambulatory Visit: Payer: 59 | Admitting: Emergency Medicine

## 2023-06-29 VITALS — BP 130/96 | HR 76 | Temp 98.4°F | Ht 69.0 in | Wt 223.1 lb

## 2023-06-29 DIAGNOSIS — I10 Essential (primary) hypertension: Secondary | ICD-10-CM | POA: Diagnosis not present

## 2023-06-29 DIAGNOSIS — Z23 Encounter for immunization: Secondary | ICD-10-CM | POA: Diagnosis not present

## 2023-06-29 DIAGNOSIS — K2270 Barrett's esophagus without dysplasia: Secondary | ICD-10-CM | POA: Diagnosis not present

## 2023-06-29 DIAGNOSIS — K219 Gastro-esophageal reflux disease without esophagitis: Secondary | ICD-10-CM

## 2023-06-29 DIAGNOSIS — E785 Hyperlipidemia, unspecified: Secondary | ICD-10-CM | POA: Diagnosis not present

## 2023-06-29 DIAGNOSIS — N529 Male erectile dysfunction, unspecified: Secondary | ICD-10-CM

## 2023-06-29 DIAGNOSIS — F411 Generalized anxiety disorder: Secondary | ICD-10-CM | POA: Diagnosis not present

## 2023-06-29 MED ORDER — SILDENAFIL CITRATE 100 MG PO TABS: 50.0000 mg | ORAL_TABLET | Freq: Every day | ORAL | 11 refills | Status: DC | PRN

## 2023-06-29 MED ORDER — BUPROPION HCL ER (XL) 300 MG PO TB24: 300.0000 mg | ORAL_TABLET | Freq: Every day | ORAL | 3 refills | Status: DC

## 2023-06-29 MED ORDER — ALPRAZOLAM 0.5 MG PO TABS: 0.5000 mg | ORAL_TABLET | Freq: Two times a day (BID) | ORAL | 1 refills | Status: DC | PRN

## 2023-06-29 NOTE — Progress Notes (Signed)
[name] 57 y.o.   Chief Complaint  Patient presents with   Medical Management of Chronic Issues    F/u appt, no concerns     HISTORY OF PRESENT ILLNESS: This is a 57 y.o. male A1A here for 68-month follow-up of chronic medical conditions including hypertension. Overall doing well.  Exercising more frequently and trying to eat better. Has no complaints or medical concerns today.  HPI   Prior to Admission medications   Medication Sig Start Date End Date Taking? Authorizing Provider  aspirin EC 81 MG tablet Take 81 mg by mouth daily.   Yes [provider]  lisinopril (ZESTRIL) 20 MG tablet Take 1 tablet (20 mg total) by mouth daily. 12/20/22  Yes Charletha Dalpe, Eilleen Kempf, MD  Lysine 1000 MG TABS Take 1,000 mg by mouth daily.   Yes [provider]  Multiple Vitamin (MULTIVITAMIN) tablet Take 1 tablet by mouth daily.   Yes [provider]  Omega-3 Fatty Acids (FISH OIL ULTRA) 1400 MG CAPS Take 1,400 mg by mouth daily.   Yes [provider]  omeprazole (PRILOSEC) 20 MG capsule TAKE 1 CAPSULE (20 MG TOTAL) BY MOUTH 2 (TWO) TIMES DAILY BEFORE A MEAL. 09/20/22  Yes Atharva Mirsky, Eilleen Kempf, MD  simvastatin (ZOCOR) 20 MG tablet TAKE 1 TABLET BY MOUTH EVERY DAY 04/10/23  Yes Billal Rollo, Eilleen Kempf, MD  ALPRAZolam Prudy Feeler) 0.5 MG tablet Take 1 tablet (0.5 mg total) by mouth 2 (two) times daily as needed for anxiety. 06/29/23   Georgina Quint, MD  buPROPion (WELLBUTRIN XL) 300 MG 24 hr tablet Take 1 tablet (300 mg total) by mouth daily. 06/29/23   Georgina Quint, MD  niacin 500 MG tablet Take 1 tablet (500 mg total) by mouth daily with breakfast. Patient not taking: Reported on 01/18/2023 05/11/18   Georgina Quint, MD  zolpidem (AMBIEN) 5 MG tablet Take 1 tablet (5 mg total) by mouth at bedtime as needed. Patient not taking: Reported on 01/18/2023 08/24/21   Georgina Quint, MD    Allergies  Allergen Reactions   Thimerosal (Thiomersal)  Swelling   Ampicillin Rash    Childhood.   Iodine Rash    Child hood.    Patient Active Problem List   Diagnosis Date Noted   Cervical radiculopathy 10/06/2021   Neuropathic pain 10/06/2021   Excessive daytime sleepiness 09/14/2021   CPAP (continuous positive airway pressure) dependence 09/14/2021   S/P UPPP (uvulopalatopharyngoplasty) 09/14/2021   History of sleep apnea 05/11/2018   Generalized anxiety disorder 05/11/2018   Sleep disturbance 05/11/2018   History of Barrett's esophagus 05/11/2018   Abnormal EKG 11/24/2016   Smoker 11/24/2016   Family history of coronary artery disease 11/24/2016   Dyslipidemia 08/09/2015   Gastroesophageal reflux disease without esophagitis 08/09/2015   Essential hypertension 08/09/2015   HTN (hypertension) 08/22/2012   Anxiety 08/22/2012   Barrett's esophagus without dysplasia 08/22/2012   OSA on CPAP     Past Medical History:  Diagnosis Date   Abdominal pain    Allergy    seasonal   Anxiety    Barrett's esophagus    Constipation    COVID-19    Diverticulitis    ED (erectile dysfunction)    Family history of adverse reaction to anesthesia    mother has hard time coming out of anethesia   Fatigue    GERD (gastroesophageal reflux disease) 12/2007   HLD (hyperlipidemia)    Hypertension    Insomnia    Myalgia  OSA on CPAP    Psoriasis    Scoliosis    Sleep apnea    Bipap   Vertigo     Past Surgical History:  Procedure Laterality Date   NASAL SINUS SURGERY     REFRACTIVE SURGERY     bilateral   UMBILICAL HERNIA REPAIR N/A 09/22/2021   Procedure: LAPAROSCOPIC REPAIR OF UMBILICAL HERNIA WITH INSERTION OF MESH;  Surgeon: Luretha Murphy, MD;  Location: [facility];  Service: General;  Laterality: N/A;   UPPER GASTROINTESTINAL ENDOSCOPY     UVULECTOMY      Social History   Socioeconomic History   Marital status: Married    Spouse name: Gladis Riffle   Number of children: 2   Years of education: 16   Highest education  level: Not on file  Occupational History   Occupation: Probation officer: [employer]  Tobacco Use   Smoking status: Former    Current packs/day: 0.00    Types: Cigarettes    Quit date: 01/19/2007    Years since quitting: 16.4   Smokeless tobacco: Never  Vaping Use   Vaping status: Never Used  Substance and Sexual Activity   Alcohol use: Not Currently    Alcohol/week: 4.0 standard drinks of alcohol    Types: 2 Glasses of wine, 2 Cans of beer per week    Comment: occasional   Drug use: No   Sexual activity: Yes    Partners: Female    Birth control/protection: None  Other Topics Concern   Not on file  Social History Narrative   Lives with wife (travelling Engineer, civil (consulting), gone about half the time) from whom he is separated, but on good terms.  Shares custody of his daughters with his first wife.  His current wife has two sons, one grown, the other lives with his father.   Right handed    Caffeine: 2 cups of coffee a day   Social Determinants of Health   Financial Resource Strain: Not on file  Food Insecurity: Not on file  Transportation Needs: Not on file  Physical Activity: Not on file  Stress: Not on file  Social Connections: Not on file  Intimate Partner Violence: Not on file    Family History  Problem Relation Age of Onset   Glaucoma Mother    Cataracts Mother        complicated surgery   Colon polyps Father    Prostate cancer Father        metastatic   Hypertension Father    Alcohol abuse Father    Heart disease Father    Diabetes Father    Cirrhosis Father    Gout Father    Arthritis Father    Anxiety disorder Sister    Hypertension Sister    Sleep apnea Sister    Multiple sclerosis Maternal Aunt    Brain cancer Maternal Uncle    Alcohol abuse Maternal Uncle    Glaucoma Paternal Uncle    Heart disease Paternal Uncle    Alcohol abuse Paternal Uncle    Colon cancer Paternal Uncle    Heart disease Paternal Uncle    Esophageal cancer Neg Hx     Rectal cancer Neg Hx    Stomach cancer Neg Hx      Review of Systems  Constitutional: Negative.  Negative for fever.  HENT: Negative.  Negative for congestion and sore throat.   Respiratory: Negative.  Negative for cough and shortness of breath.   Cardiovascular:  Negative.  Negative for chest pain and palpitations.  Gastrointestinal:  Negative for abdominal pain, diarrhea, nausea and vomiting.  Genitourinary: Negative.  Negative for dysuria and hematuria.       Decreased libido and erectile dysfunction  Skin: Negative.  Negative for rash.  Neurological: Negative.  Negative for dizziness and headaches.  All other systems reviewed and are negative.   Vitals:   06/29/23 1306  BP: (!) 130/96  Pulse: 76  Temp: 98.4 F (36.9 C)  SpO2: 93%    Physical Exam Vitals reviewed.  Constitutional:      Appearance: Normal appearance.  HENT:     Head: Normocephalic.     Mouth/Throat:     Mouth: Mucous membranes are moist.     Pharynx: Oropharynx is clear.  Eyes:     Extraocular Movements: Extraocular movements intact.     Conjunctiva/sclera: Conjunctivae normal.     Pupils: Pupils are equal, round, and reactive to light.  Cardiovascular:     Rate and Rhythm: Normal rate and regular rhythm.     Pulses: Normal pulses.     Heart sounds: Normal heart sounds.  Pulmonary:     Effort: Pulmonary effort is normal.     Breath sounds: Normal breath sounds.  Musculoskeletal:     Cervical back: No tenderness.  Lymphadenopathy:     Cervical: No cervical adenopathy.  Skin:    General: Skin is warm and dry.     Capillary Refill: Capillary refill takes less than 2 seconds.  Neurological:     General: No focal deficit present.     Mental Status: He is alert and oriented to person, place, and time.  Psychiatric:        Mood and Affect: Mood normal.        Behavior: Behavior normal.      ASSESSMENT & PLAN: A total of 44 minutes was spent with the patient and counseling/coordination of  care regarding preparing for this visit, review of most recent office visit notes, review of multiple chronic medical conditions and their management, review of all medications, review of most recent blood work results, education on nutrition, cardiovascular risks associated with hypertension and dyslipidemia, review of health maintenance items and recent upper endoscopy and colonoscopy reports, prognosis, documentation, and need for follow-up.  Problem List Items Addressed This Visit       Cardiovascular and Mediastinum   Essential hypertension    Elevated blood pressure reading in the office but normal at home Continue lisinopril 20 mg daily Cardiovascular risk associated with hypertension discussed Dietary approaches to stop hypertension discussed      Relevant Medications   sildenafil (VIAGRA) 100 MG tablet     Digestive   Gastroesophageal reflux disease without esophagitis (Chronic)    Stable and well-controlled. Continues omeprazole 20 mg daily      Barrett's esophagus without dysplasia    Stable.  Recently had upper and lower endoscopies.  Reports reviewed        Other   Dyslipidemia    Chronic stable condition Diet and nutrition discussed Continue simvastatin 20 mg daily      Generalized anxiety disorder    Stable and well-controlled Continues Wellbutrin XL 300 mg daily and alprazolam as needed      Relevant Medications   buPROPion (WELLBUTRIN XL) 300 MG 24 hr tablet   ALPRAZolam (XANAX) 0.5 MG tablet   Other Visit Diagnoses     Need for vaccination    -  Primary   Relevant Orders  Flu vaccine trivalent PF, 6mos and older(Flulaval,Afluria,Fluarix,Fluzone)   Need for shingles vaccine       Relevant Orders   Zoster, Recombinant (Shingrix)   Erectile dysfunction, unspecified erectile dysfunction type       Relevant Medications   sildenafil (VIAGRA) 100 MG tablet      Patient Instructions  Hypertension, Adult High blood pressure (hypertension) is when  the force of blood pumping through the arteries is too strong. The arteries are the blood vessels that carry blood from the heart throughout the body. Hypertension forces the heart to work harder to pump blood and may cause arteries to become narrow or stiff. Untreated or uncontrolled hypertension can lead to a heart attack, heart failure, a stroke, kidney disease, and other problems. A blood pressure reading consists of a higher number over a lower number. Ideally, your blood pressure should be below 120/80. The first ("top") number is called the systolic pressure. It is a measure of the pressure in your arteries as your heart beats. The second ("bottom") number is called the diastolic pressure. It is a measure of the pressure in your arteries as the heart relaxes. What are the causes? The exact cause of this condition is not known. There are some conditions that result in high blood pressure. What increases the risk? Certain factors may make you more likely to develop high blood pressure. Some of these risk factors are under your control, including: Smoking. Not getting enough exercise or physical activity. Being overweight. Having too much fat, sugar, calories, or salt (sodium) in your diet. Drinking too much alcohol. Other risk factors include: Having a personal history of heart disease, diabetes, high cholesterol, or kidney disease. Stress. Having a family history of high blood pressure and high cholesterol. Having obstructive sleep apnea. Age. The risk increases with age. What are the signs or symptoms? High blood pressure may not cause symptoms. Very high blood pressure (hypertensive crisis) may cause: Headache. Fast or irregular heartbeats (palpitations). Shortness of breath. Nosebleed. Nausea and vomiting. Vision changes. Severe chest pain, dizziness, and seizures. How is this diagnosed? This condition is diagnosed by measuring your blood pressure while you are seated, with your  arm resting on a flat surface, your legs uncrossed, and your feet flat on the floor. The cuff of the blood pressure monitor will be placed directly against the skin of your upper arm at the level of your heart. Blood pressure should be measured at least twice using the same arm. Certain conditions can cause a difference in blood pressure between your right and left arms. If you have a high blood pressure reading during one visit or you have normal blood pressure with other risk factors, you may be asked to: Return on a different day to have your blood pressure checked again. Monitor your blood pressure at home for 1 week or longer. If you are diagnosed with hypertension, you may have other blood or imaging tests to help your health care provider understand your overall risk for other conditions. How is this treated? This condition is treated by making healthy lifestyle changes, such as eating healthy foods, exercising more, and reducing your alcohol intake. You may be referred for counseling on a healthy diet and physical activity. Your health care provider may prescribe medicine if lifestyle changes are not enough to get your blood pressure under control and if: Your systolic blood pressure is above 130. Your diastolic blood pressure is above 80. Your personal target blood pressure may vary depending on your  medical conditions, your age, and other factors. Follow these instructions at home: Eating and drinking  Eat a diet that is high in fiber and potassium, and low in sodium, added sugar, and fat. An example of this eating plan is called the DASH diet. DASH stands for Dietary Approaches to Stop Hypertension. To eat this way: Eat plenty of fresh fruits and vegetables. Try to fill one half of your plate at each meal with fruits and vegetables. Eat whole grains, such as whole-wheat pasta, brown rice, or whole-grain bread. Fill about one fourth of your plate with whole grains. Eat or drink low-fat  dairy products, such as skim milk or low-fat yogurt. Avoid fatty cuts of meat, processed or cured meats, and poultry with skin. Fill about one fourth of your plate with lean proteins, such as fish, chicken without skin, beans, eggs, or tofu. Avoid pre-made and processed foods. These tend to be higher in sodium, added sugar, and fat. Reduce your daily sodium intake. Many people with hypertension should eat less than 1,500 mg of sodium a day. Do not drink alcohol if: Your health care provider tells you not to drink. You are pregnant, may be pregnant, or are planning to become pregnant. If you drink alcohol: Limit how much you have to: 0-1 drink a day for women. 0-2 drinks a day for men. Know how much alcohol is in your drink. In the [place] one drink equals one 12 oz bottle of beer (355 mL), one 5 oz glass of wine (148 mL), or one 1 oz glass of hard liquor (44 mL). Lifestyle  Work with your health care provider to maintain a healthy body weight or to lose weight. Ask what an ideal weight is for you. Get at least 30 minutes of exercise that causes your heart to beat faster (aerobic exercise) most days of the week. Activities may include walking, swimming, or biking. Include exercise to strengthen your muscles (resistance exercise), such as Pilates or lifting weights, as part of your weekly exercise routine. Try to do these types of exercises for 30 minutes at least 3 days a week. Do not use any products that contain nicotine or tobacco. These products include cigarettes, chewing tobacco, and vaping devices, such as e-cigarettes. If you need help quitting, ask your health care provider. Monitor your blood pressure at home as told by your health care provider. Keep all follow-up visits. This is important. Medicines Take over-the-counter and prescription medicines only as told by your health care provider. Follow directions carefully. Blood pressure medicines must be taken as prescribed. Do not skip  doses of blood pressure medicine. Doing this puts you at risk for problems and can make the medicine less effective. Ask your health care provider about side effects or reactions to medicines that you should watch for. Contact a health care provider if you: Think you are having a reaction to a medicine you are taking. Have headaches that keep coming back (recurring). Feel dizzy. Have swelling in your ankles. Have trouble with your vision. Get help right away if you: Develop a severe headache or confusion. Have unusual weakness or numbness. Feel faint. Have severe pain in your chest or abdomen. Vomit repeatedly. Have trouble breathing. These symptoms may be an emergency. Get help right away. Call 911. Do not wait to see if the symptoms will go away. Do not drive yourself to the hospital. Summary Hypertension is when the force of blood pumping through your arteries is too strong. If this condition is not  controlled, it may put you at risk for serious complications. Your personal target blood pressure may vary depending on your medical conditions, your age, and other factors. For most people, a normal blood pressure is less than 120/80. Hypertension is treated with lifestyle changes, medicines, or a combination of both. Lifestyle changes include losing weight, eating a healthy, low-sodium diet, exercising more, and limiting alcohol. This information is not intended to replace advice given to you by your health care provider. Make sure you discuss any questions you have with your health care provider. Document Revised: 07/28/2021 Document Reviewed: 07/28/2021 Elsevier Patient Education  2024 [employer]     Edwina Barth, MD [facility]

## 2023-06-29 NOTE — Assessment & Plan Note (Signed)
Stable.  Recently had upper and lower endoscopies.  Reports reviewed

## 2023-06-29 NOTE — Assessment & Plan Note (Signed)
Chronic stable condition. Diet and nutrition discussed. Continue simvastatin 20 mg daily

## 2023-06-29 NOTE — Patient Instructions (Signed)
Hypertension, Adult High blood pressure (hypertension) is when the force of blood pumping through the arteries is too strong. The arteries are the blood vessels that carry blood from the heart throughout the body. Hypertension forces the heart to work harder to pump blood and may cause arteries to become narrow or stiff. Untreated or uncontrolled hypertension can lead to a heart attack, heart failure, a stroke, kidney disease, and other problems. A blood pressure reading consists of a higher number over a lower number. Ideally, your blood pressure should be below 120/80. The first ("top") number is called the systolic pressure. It is a measure of the pressure in your arteries as your heart beats. The second ("bottom") number is called the diastolic pressure. It is a measure of the pressure in your arteries as the heart relaxes. What are the causes? The exact cause of this condition is not known. There are some conditions that result in high blood pressure. What increases the risk? Certain factors may make you more likely to develop high blood pressure. Some of these risk factors are under your control, including: Smoking. Not getting enough exercise or physical activity. Being overweight. Having too much fat, sugar, calories, or salt (sodium) in your diet. Drinking too much alcohol. Other risk factors include: Having a personal history of heart disease, diabetes, high cholesterol, or kidney disease. Stress. Having a family history of high blood pressure and high cholesterol. Having obstructive sleep apnea. Age. The risk increases with age. What are the signs or symptoms? High blood pressure may not cause symptoms. Very high blood pressure (hypertensive crisis) may cause: Headache. Fast or irregular heartbeats (palpitations). Shortness of breath. Nosebleed. Nausea and vomiting. Vision changes. Severe chest pain, dizziness, and seizures. How is this diagnosed? This condition is diagnosed by  measuring your blood pressure while you are seated, with your arm resting on a flat surface, your legs uncrossed, and your feet flat on the floor. The cuff of the blood pressure monitor will be placed directly against the skin of your upper arm at the level of your heart. Blood pressure should be measured at least twice using the same arm. Certain conditions can cause a difference in blood pressure between your right and left arms. If you have a high blood pressure reading during one visit or you have normal blood pressure with other risk factors, you may be asked to: Return on a different day to have your blood pressure checked again. Monitor your blood pressure at home for 1 week or longer. If you are diagnosed with hypertension, you may have other blood or imaging tests to help your health care provider understand your overall risk for other conditions. How is this treated? This condition is treated by making healthy lifestyle changes, such as eating healthy foods, exercising more, and reducing your alcohol intake. You may be referred for counseling on a healthy diet and physical activity. Your health care provider may prescribe medicine if lifestyle changes are not enough to get your blood pressure under control and if: Your systolic blood pressure is above 130. Your diastolic blood pressure is above 80. Your personal target blood pressure may vary depending on your medical conditions, your age, and other factors. Follow these instructions at home: Eating and drinking  Eat a diet that is high in fiber and potassium, and low in sodium, added sugar, and fat. An example of this eating plan is called the DASH diet. DASH stands for Dietary Approaches to Stop Hypertension. To eat this way: Eat   plenty of fresh fruits and vegetables. Try to fill one half of your plate at each meal with fruits and vegetables. Eat whole grains, such as whole-wheat pasta, brown rice, or whole-grain bread. Fill about one  fourth of your plate with whole grains. Eat or drink low-fat dairy products, such as skim milk or low-fat yogurt. Avoid fatty cuts of meat, processed or cured meats, and poultry with skin. Fill about one fourth of your plate with lean proteins, such as fish, chicken without skin, beans, eggs, or tofu. Avoid pre-made and processed foods. These tend to be higher in sodium, added sugar, and fat. Reduce your daily sodium intake. Many people with hypertension should eat less than 1,500 mg of sodium a day. Do not drink alcohol if: Your health care provider tells you not to drink. You are pregnant, may be pregnant, or are planning to become pregnant. If you drink alcohol: Limit how much you have to: 0-1 drink a day for women. 0-2 drinks a day for men. Know how much alcohol is in your drink. In the [place] one drink equals one 12 oz bottle of beer (355 mL), one 5 oz glass of wine (148 mL), or one 1 oz glass of hard liquor (44 mL). Lifestyle  Work with your health care provider to maintain a healthy body weight or to lose weight. Ask what an ideal weight is for you. Get at least 30 minutes of exercise that causes your heart to beat faster (aerobic exercise) most days of the week. Activities may include walking, swimming, or biking. Include exercise to strengthen your muscles (resistance exercise), such as Pilates or lifting weights, as part of your weekly exercise routine. Try to do these types of exercises for 30 minutes at least 3 days a week. Do not use any products that contain nicotine or tobacco. These products include cigarettes, chewing tobacco, and vaping devices, such as e-cigarettes. If you need help quitting, ask your health care provider. Monitor your blood pressure at home as told by your health care provider. Keep all follow-up visits. This is important. Medicines Take over-the-counter and prescription medicines only as told by your health care provider. Follow directions carefully. Blood  pressure medicines must be taken as prescribed. Do not skip doses of blood pressure medicine. Doing this puts you at risk for problems and can make the medicine less effective. Ask your health care provider about side effects or reactions to medicines that you should watch for. Contact a health care provider if you: Think you are having a reaction to a medicine you are taking. Have headaches that keep coming back (recurring). Feel dizzy. Have swelling in your ankles. Have trouble with your vision. Get help right away if you: Develop a severe headache or confusion. Have unusual weakness or numbness. Feel faint. Have severe pain in your chest or abdomen. Vomit repeatedly. Have trouble breathing. These symptoms may be an emergency. Get help right away. Call 911. Do not wait to see if the symptoms will go away. Do not drive yourself to the hospital. Summary Hypertension is when the force of blood pumping through your arteries is too strong. If this condition is not controlled, it may put you at risk for serious complications. Your personal target blood pressure may vary depending on your medical conditions, your age, and other factors. For most people, a normal blood pressure is less than 120/80. Hypertension is treated with lifestyle changes, medicines, or a combination of both. Lifestyle changes include losing weight, eating a healthy,   low-sodium diet, exercising more, and limiting alcohol. This information is not intended to replace advice given to you by your health care provider. Make sure you discuss any questions you have with your health care provider. Document Revised: 07/28/2021 Document Reviewed: 07/28/2021 Elsevier Patient Education  2024 Elsevier Inc.  

## 2023-06-29 NOTE — Assessment & Plan Note (Signed)
Stable and well-controlled Continues Wellbutrin XL 300 mg daily and alprazolam as needed

## 2023-06-29 NOTE — Assessment & Plan Note (Signed)
Elevated blood pressure reading in the office but normal at home Continue lisinopril 20 mg daily Cardiovascular risk associated with hypertension discussed Dietary approaches to stop hypertension discussed

## 2023-06-29 NOTE — Assessment & Plan Note (Signed)
Stable and well-controlled.  Continues omeprazole 20 mg daily.

## 2023-12-21 ENCOUNTER — Other Ambulatory Visit: Payer: Self-pay | Admitting: Emergency Medicine

## 2023-12-21 DIAGNOSIS — I10 Essential (primary) hypertension: Secondary | ICD-10-CM

## 2024-01-10 ENCOUNTER — Telehealth: Payer: Self-pay

## 2024-01-10 NOTE — Telephone Encounter (Signed)
 Called pt and asked him to bring his Machine in so we can download his Compliance Report, pt states that he can bring his machine Thursday.

## 2024-01-11 ENCOUNTER — Telehealth (INDEPENDENT_AMBULATORY_CARE_PROVIDER_SITE_OTHER): Payer: 59 | Admitting: Adult Health

## 2024-01-11 DIAGNOSIS — G4733 Obstructive sleep apnea (adult) (pediatric): Secondary | ICD-10-CM | POA: Diagnosis not present

## 2024-01-11 NOTE — Patient Instructions (Signed)
 Continue using CPAP nightly and greater than 4 hours each night Please bring your machine by for download If your symptoms worsen or you develop new symptoms please let us know.

## 2024-01-11 NOTE — Progress Notes (Signed)
 PATIENT: [name] DOB: 24-Sep-1966  REASON FOR VISIT: follow up HISTORY FROM: patient PRIMARY NEUROLOGIST: Dr. Vickey Huger   Virtual Visit via Video Note  I connected with [name] on 01/11/24 at 11:00 AM EDT by a video enabled telemedicine application located remotely at [facility] and verified that I am speaking with the correct person using two identifiers who was located at their own home.   I discussed the limitations of evaluation and management by telemedicine and the availability of in person appointments. The patient expressed understanding and agreed to proceed.    HISTORY OF PRESENT ILLNESS: Today 01/11/24:  [name] is a 58 y.o. male with a history of OSA on CPAP. Returns today for follow-up.  Reports that his CPAP continues to work well for him.  He does not like to sleep without it.  In fact he states that he cannot sleep without it.  His machine is a Network engineer.  He will bring his machine by the office tomorrow and we will get a download.  Once we have report I will review that with him through MyChart.   01/11/23: [name] is a 58 y.o. male with a history of obstructive sleep apnea on CPAP. Returns today for follow-up.  He reports that the CPAP is working well.  He denies any new issues.  Continues to find it beneficial.  Does not like to sleep without it       01/05/22: [name] is a 58 year old male with a history of OSA on CPAP. He returns today for follow-up.  He reports that his new CPAP is working well.  He denies any new issues.      REVIEW OF SYSTEMS: Out of a complete 14 system review of symptoms, the patient complains only of the following symptoms, and all other reviewed systems are negative.   ESS  4 FSS 6  ALLERGIES: Allergies  Allergen Reactions   Thimerosal (Thiomersal) Swelling   Ampicillin Rash    Childhood.   Iodine Rash    Child hood.    HOME  MEDICATIONS: Outpatient Medications Prior to Visit  Medication Sig Dispense Refill   ALPRAZolam (XANAX) 0.5 MG tablet Take 1 tablet (0.5 mg total) by mouth 2 (two) times daily as needed for anxiety. 30 tablet 1   aspirin EC 81 MG tablet Take 81 mg by mouth daily.     buPROPion (WELLBUTRIN XL) 300 MG 24 hr tablet Take 1 tablet (300 mg total) by mouth daily. 90 tablet 3   lisinopril (ZESTRIL) 20 MG tablet TAKE 1 TABLET BY MOUTH EVERY DAY 90 tablet 3   Lysine 1000 MG TABS Take 1,000 mg by mouth daily.     Multiple Vitamin (MULTIVITAMIN) tablet Take 1 tablet by mouth daily.     niacin 500 MG tablet Take 1 tablet (500 mg total) by mouth daily with breakfast. (Patient not taking: Reported on 01/18/2023) 90 tablet 3   Omega-3 Fatty Acids (FISH OIL ULTRA) 1400 MG CAPS Take 1,400 mg by mouth daily.     omeprazole (PRILOSEC) 20 MG capsule TAKE 1 CAPSULE (20 MG TOTAL) BY MOUTH 2 (TWO) TIMES DAILY BEFORE A MEAL. 180 capsule 3   sildenafil (VIAGRA) 100 MG tablet Take 0.5-1 tablets (50-100 mg total) by mouth daily as needed for erectile dysfunction. 5 tablet 11   simvastatin (ZOCOR) 20 MG tablet TAKE 1 TABLET BY MOUTH EVERY DAY 90 tablet 2   zolpidem (AMBIEN) 5 MG  tablet Take 1 tablet (5 mg total) by mouth at bedtime as needed. (Patient not taking: Reported on 01/18/2023) 30 tablet 2   No facility-administered medications prior to visit.    PAST MEDICAL HISTORY: Past Medical History:  Diagnosis Date   Abdominal pain    Allergy    seasonal   Anxiety    Barrett's esophagus    Constipation    COVID-19    Diverticulitis    ED (erectile dysfunction)    Family history of adverse reaction to anesthesia    mother has hard time coming out of anethesia   Fatigue    GERD (gastroesophageal reflux disease) 12/2007   HLD (hyperlipidemia)    Hypertension    Insomnia    Myalgia    OSA on CPAP    Psoriasis    Scoliosis    Sleep apnea    Bipap   Vertigo     PAST SURGICAL HISTORY: Past Surgical History:   Procedure Laterality Date   NASAL SINUS SURGERY     REFRACTIVE SURGERY     bilateral   UMBILICAL HERNIA REPAIR N/A 09/22/2021   Procedure: LAPAROSCOPIC REPAIR OF UMBILICAL HERNIA WITH INSERTION OF MESH;  Surgeon: Luretha Murphy, MD;  Location: [facility];  Service: General;  Laterality: N/A;   UPPER GASTROINTESTINAL ENDOSCOPY     UVULECTOMY      FAMILY HISTORY: Family History  Problem Relation Age of Onset   Glaucoma Mother    Cataracts Mother        complicated surgery   Colon polyps Father    Prostate cancer Father        metastatic   Hypertension Father    Alcohol abuse Father    Heart disease Father    Diabetes Father    Cirrhosis Father    Gout Father    Arthritis Father    Anxiety disorder Sister    Hypertension Sister    Sleep apnea Sister    Multiple sclerosis Maternal Aunt    Brain cancer Maternal Uncle    Alcohol abuse Maternal Uncle    Glaucoma Paternal Uncle    Heart disease Paternal Uncle    Alcohol abuse Paternal Uncle    Colon cancer Paternal Uncle    Heart disease Paternal Uncle    Esophageal cancer Neg Hx    Rectal cancer Neg Hx    Stomach cancer Neg Hx     SOCIAL HISTORY: Social History   Socioeconomic History   Marital status: Married    Spouse name: Gladis Riffle   Number of children: 2   Years of education: 16   Highest education level: Not on file  Occupational History   Occupation: Technical brewer    Employer: [employer]  Tobacco Use   Smoking status: Former    Current packs/day: 0.00    Types: Cigarettes    Quit date: 01/19/2007    Years since quitting: 16.9   Smokeless tobacco: Never  Vaping Use   Vaping status: Never Used  Substance and Sexual Activity   Alcohol use: Not Currently    Alcohol/week: 4.0 standard drinks of alcohol    Types: 2 Glasses of wine, 2 Cans of beer per week    Comment: occasional   Drug use: No   Sexual activity: Yes    Partners: Female    Birth control/protection: None  Other Topics  Concern   Not on file  Social History Narrative   Lives with wife (travelling Engineer, civil (consulting), gone about half the  time) from whom he is separated, but on good terms.  Shares custody of his daughters with his first wife.  His current wife has two sons, one grown, the other lives with his father.   Right handed    Caffeine: 2 cups of coffee a day   Social Drivers of Corporate investment banker Strain: Not on file  Food Insecurity: Not on file  Transportation Needs: Not on file  Physical Activity: Not on file  Stress: Not on file  Social Connections: Not on file  Intimate Partner Violence: Not on file      PHYSICAL EXAM   Generalized: Well developed, in no acute distress    Neurological examination  Mentation: Alert oriented to time, place, history taking. Follows all commands speech and language fluent Cranial nerve II-XII: Facial symmetry noted    DIAGNOSTIC DATA (LABS, IMAGING, TESTING) - I reviewed patient records, labs, notes, testing and imaging myself where available.  Lab Results  Component Value Date   WBC 5.7 12/18/2022   HGB 15.1 12/18/2022   HCT 43.7 12/18/2022   MCV 85.9 12/18/2022   PLT 271 12/18/2022      Component Value Date/Time   NA 136 12/18/2022 1014   NA 140 03/27/2020 1807   K 4.2 12/18/2022 1014   CL 104 12/18/2022 1014   CO2 25 12/18/2022 1014   GLUCOSE 117 (H) 12/18/2022 1014   BUN 14 12/18/2022 1014   BUN 12 03/27/2020 1807   CREATININE 0.84 12/18/2022 1014   CREATININE 0.85 08/09/2015 1033   CALCIUM 9.5 12/18/2022 1014   PROT 7.2 12/18/2022 1014   PROT 7.1 03/27/2020 1807   ALBUMIN 4.5 12/18/2022 1014   ALBUMIN 4.8 03/27/2020 1807   AST 20 12/18/2022 1014   ALT 28 12/18/2022 1014   ALKPHOS 83 12/18/2022 1014   BILITOT 0.6 12/18/2022 1014   BILITOT <0.2 03/27/2020 1807   GFRNONAA >60 12/18/2022 1014   GFRNONAA >89 08/09/2015 1033   GFRAA >60 05/24/2020 1848   GFRAA >89 08/09/2015 1033   Lab Results  Component Value Date   CHOL 164  12/06/2022   HDL 44.00 12/06/2022   LDLCALC 85 12/06/2022   TRIG 177.0 (H) 12/06/2022   CHOLHDL 4 12/06/2022   Lab Results  Component Value Date   HGBA1C 5.9 12/06/2022   No results found for: "VITAMINB12" Lab Results  Component Value Date   TSH 0.908 06/21/2019      ASSESSMENT AND PLAN 58 y.o. year old male  has a past medical history of Abdominal pain, Allergy, Anxiety, Barrett's esophagus, Constipation, COVID-19, Diverticulitis, ED (erectile dysfunction), Family history of adverse reaction to anesthesia, Fatigue, GERD (gastroesophageal reflux disease) (12/2007), HLD (hyperlipidemia), Hypertension, Insomnia, Myalgia, OSA on CPAP, Psoriasis, Scoliosis, Sleep apnea, and Vertigo. here with:  OSA on CPAP  - CPAP compliance excellent - Good treatment of AHI  - Encourage patient to use CPAP nightly and > 4 hours each night - F/U in 1 year or sooner if needed  .  Butch Penny, MSN, NP-C 01/11/2024, 10:51 AM [facility] [address] [place], [place] [address] [phone]

## 2024-03-28 ENCOUNTER — Other Ambulatory Visit: Payer: Self-pay | Admitting: Emergency Medicine

## 2024-03-28 DIAGNOSIS — E782 Mixed hyperlipidemia: Secondary | ICD-10-CM

## 2024-07-03 ENCOUNTER — Other Ambulatory Visit: Payer: Self-pay | Admitting: Emergency Medicine

## 2024-07-03 DIAGNOSIS — E782 Mixed hyperlipidemia: Secondary | ICD-10-CM

## 2024-07-04 ENCOUNTER — Other Ambulatory Visit: Payer: Self-pay | Admitting: Emergency Medicine

## 2024-07-04 DIAGNOSIS — F411 Generalized anxiety disorder: Secondary | ICD-10-CM

## 2024-07-09 ENCOUNTER — Other Ambulatory Visit: Payer: Self-pay | Admitting: Emergency Medicine

## 2024-07-09 DIAGNOSIS — N529 Male erectile dysfunction, unspecified: Secondary | ICD-10-CM

## 2024-09-05 ENCOUNTER — Ambulatory Visit: Admitting: Emergency Medicine

## 2024-09-05 ENCOUNTER — Other Ambulatory Visit: Payer: Self-pay | Admitting: Emergency Medicine

## 2024-09-05 ENCOUNTER — Encounter: Payer: Self-pay | Admitting: Emergency Medicine

## 2024-09-05 VITALS — BP 130/70 | HR 79 | Temp 98.3°F | Ht 69.0 in | Wt 226.0 lb

## 2024-09-05 DIAGNOSIS — Z0001 Encounter for general adult medical examination with abnormal findings: Secondary | ICD-10-CM

## 2024-09-05 DIAGNOSIS — I10 Essential (primary) hypertension: Secondary | ICD-10-CM | POA: Diagnosis not present

## 2024-09-05 DIAGNOSIS — E6609 Other obesity due to excess calories: Secondary | ICD-10-CM

## 2024-09-05 DIAGNOSIS — E66811 Obesity, class 1: Secondary | ICD-10-CM | POA: Diagnosis not present

## 2024-09-05 DIAGNOSIS — Z125 Encounter for screening for malignant neoplasm of prostate: Secondary | ICD-10-CM

## 2024-09-05 DIAGNOSIS — E785 Hyperlipidemia, unspecified: Secondary | ICD-10-CM

## 2024-09-05 DIAGNOSIS — Z130 Encounter for screening for diseases of the blood and blood-forming organs and certain disorders involving the immune mechanism: Secondary | ICD-10-CM

## 2024-09-05 DIAGNOSIS — Z0000 Encounter for general adult medical examination without abnormal findings: Secondary | ICD-10-CM | POA: Diagnosis not present

## 2024-09-05 DIAGNOSIS — F411 Generalized anxiety disorder: Secondary | ICD-10-CM

## 2024-09-05 DIAGNOSIS — G4733 Obstructive sleep apnea (adult) (pediatric): Secondary | ICD-10-CM | POA: Diagnosis not present

## 2024-09-05 DIAGNOSIS — K2270 Barrett's esophagus without dysplasia: Secondary | ICD-10-CM

## 2024-09-05 DIAGNOSIS — Z6833 Body mass index (BMI) 33.0-33.9, adult: Secondary | ICD-10-CM | POA: Diagnosis not present

## 2024-09-05 LAB — COMPREHENSIVE METABOLIC PANEL WITH GFR
ALT: 30 U/L (ref 0–53)
AST: 23 U/L (ref 0–37)
Albumin: 4.5 g/dL (ref 3.5–5.2)
Alkaline Phosphatase: 92 U/L (ref 39–117)
BUN: 13 mg/dL (ref 6–23)
CO2: 25 meq/L (ref 19–32)
Calcium: 9.4 mg/dL (ref 8.4–10.5)
Chloride: 101 meq/L (ref 96–112)
Creatinine, Ser: 0.81 mg/dL (ref 0.40–1.50)
GFR: 97.28 mL/min (ref >60.00)
Glucose, Bld: 84 mg/dL (ref 70–99)
Potassium: 4.2 meq/L (ref 3.5–5.1)
Sodium: 139 meq/L (ref 135–145)
Total Bilirubin: 0.6 mg/dL (ref 0.2–1.2)
Total Protein: 7 g/dL (ref 6.0–8.3)

## 2024-09-05 LAB — LIPID PANEL
Cholesterol: 171 mg/dL (ref 0–200)
HDL: 44.8 mg/dL (ref >39.00)
LDL Cholesterol: 96 mg/dL (ref 0–99)
NonHDL: 126.69
Total CHOL/HDL Ratio: 4
Triglycerides: 154 mg/dL — ABNORMAL HIGH (ref 0.0–149.0)
VLDL: 30.8 mg/dL (ref 0.0–40.0)

## 2024-09-05 LAB — CBC WITH DIFFERENTIAL/PLATELET
Basophils Absolute: 0 K/uL (ref 0.0–0.1)
Basophils Relative: 0.5 % (ref 0.0–3.0)
Eosinophils Absolute: 0.1 K/uL (ref 0.0–0.7)
Eosinophils Relative: 0.8 % (ref 0.0–5.0)
HCT: 45.3 % (ref 39.0–52.0)
Hemoglobin: 15.4 g/dL (ref 13.0–17.0)
Lymphocytes Relative: 26.3 % (ref 12.0–46.0)
Lymphs Abs: 2 K/uL (ref 0.7–4.0)
MCHC: 34 g/dL (ref 30.0–36.0)
MCV: 88.2 fl (ref 78.0–100.0)
Monocytes Absolute: 0.7 K/uL (ref 0.1–1.0)
Monocytes Relative: 9.3 % (ref 3.0–12.0)
Neutro Abs: 4.8 K/uL (ref 1.4–7.7)
Neutrophils Relative %: 63.1 % (ref 43.0–77.0)
Platelets: 257 K/uL (ref 150.0–400.0)
RBC: 5.14 Mil/uL (ref 4.22–5.81)
RDW: 13.7 % (ref 11.5–15.5)
WBC: 7.6 K/uL (ref 4.0–10.5)

## 2024-09-05 LAB — HEMOGLOBIN A1C: Hgb A1c MFr Bld: 5.8 % (ref 4.6–6.5)

## 2024-09-05 LAB — PSA: PSA: 1.47 ng/mL (ref 0.10–4.00)

## 2024-09-05 MED ORDER — ZEPBOUND 2.5 MG/0.5ML ~~LOC~~ SOAJ: 2.5000 mg | SUBCUTANEOUS | 3 refills | Status: DC

## 2024-09-05 NOTE — Assessment & Plan Note (Signed)
 BP Readings from Last 3 Encounters:  09/05/24 (!) 140/100  06/29/23 (!) 130/96  02/11/23 124/87  Elevated blood pressure reading in the office but normal at home Continues lisinopril  20 mg daily Advised to monitor blood pressure readings at home daily for the next couple of weeks and contact the office if numbers persistently abnormal. May need to increase dose of lisinopril 

## 2024-09-05 NOTE — Assessment & Plan Note (Signed)
 Stable.  No concerns. Continues omeprazole  20 mg daily

## 2024-09-05 NOTE — Assessment & Plan Note (Signed)
 Chronic stable condition Diet and nutrition discussed Continue simvastatin  20 mg daily Lipid file done today The 10-year ASCVD risk score (Arnett DK, et al., 2019) is: 9%   Values used to calculate the score:     Age: 58 years     Clincally relevant sex: Male     Is Non-Hispanic African American: No     Diabetic: No     Tobacco smoker: No     Systolic Blood Pressure: 140 mmHg     Is BP treated: Yes     HDL Cholesterol: 44 mg/dL     Total Cholesterol: 164 mg/dL

## 2024-09-05 NOTE — Patient Instructions (Signed)
 Health Maintenance, Male  Adopting a healthy lifestyle and getting preventive care are important in promoting health and wellness. Ask your health care provider about:  The right schedule for you to have regular tests and exams.  Things you can do on your own to prevent diseases and keep yourself healthy.  What should I know about diet, weight, and exercise?  Eat a healthy diet    Eat a diet that includes plenty of vegetables, fruits, low-fat dairy products, and lean protein.  Do not eat a lot of foods that are high in solid fats, added sugars, or sodium.  Maintain a healthy weight  Body mass index (BMI) is a measurement that can be used to identify possible weight problems. It estimates body fat based on height and weight. Your health care provider can help determine your BMI and help you achieve or maintain a healthy weight.  Get regular exercise  Get regular exercise. This is one of the most important things you can do for your health. Most adults should:  Exercise for at least 150 minutes each week. The exercise should increase your heart rate and make you sweat (moderate-intensity exercise).  Do strengthening exercises at least twice a week. This is in addition to the moderate-intensity exercise.  Spend less time sitting. Even light physical activity can be beneficial.  Watch cholesterol and blood lipids  Have your blood tested for lipids and cholesterol at 58 years of age, then have this test every 5 years.  You may need to have your cholesterol levels checked more often if:  Your lipid or cholesterol levels are high.  You are older than 58 years of age.  You are at high risk for heart disease.  What should I know about cancer screening?  Many types of cancers can be detected early and may often be prevented. Depending on your health history and family history, you may need to have cancer screening at various ages. This may include screening for:  Colorectal cancer.  Prostate cancer.  Skin cancer.  Lung  cancer.  What should I know about heart disease, diabetes, and high blood pressure?  Blood pressure and heart disease  High blood pressure causes heart disease and increases the risk of stroke. This is more likely to develop in people who have high blood pressure readings or are overweight.  Talk with your health care provider about your target blood pressure readings.  Have your blood pressure checked:  Every 3-5 years if you are 58-52 years of age.  Every year if you are 3 years old or older.  If you are between the ages of 60 and 72 and are a current or former smoker, ask your health care provider if you should have a one-time screening for abdominal aortic aneurysm (AAA).  Diabetes  Have regular diabetes screenings. This checks your fasting blood sugar level. Have the screening done:  Once every three years after age 66 if you are at a normal weight and have a low risk for diabetes.  More often and at a younger age if you are overweight or have a high risk for diabetes.  What should I know about preventing infection?  Hepatitis B  If you have a higher risk for hepatitis B, you should be screened for this virus. Talk with your health care provider to find out if you are at risk for hepatitis B infection.  Hepatitis C  Blood testing is recommended for:  Everyone born from [birth year] through 1965.  Anyone  with known risk factors for hepatitis C.  Sexually transmitted infections (STIs)  You should be screened each year for STIs, including gonorrhea and chlamydia, if:  You are sexually active and are younger than 58 years of age.  You are older than 58 years of age and your health care provider tells you that you are at risk for this type of infection.  Your sexual activity has changed since you were last screened, and you are at increased risk for chlamydia or gonorrhea. Ask your health care provider if you are at risk.  Ask your health care provider about whether you are at high risk for HIV. Your health care provider  may recommend a prescription medicine to help prevent HIV infection. If you choose to take medicine to prevent HIV, you should first get tested for HIV. You should then be tested every 3 months for as long as you are taking the medicine.  Follow these instructions at home:  Alcohol use  Do not drink alcohol if your health care provider tells you not to drink.  If you drink alcohol:  Limit how much you have to 0-2 drinks a day.  Know how much alcohol is in your drink. In the [place] one drink equals one 12 oz bottle of beer (355 mL), one 5 oz glass of wine (148 mL), or one 1 oz glass of hard liquor (44 mL).  Lifestyle  Do not use any products that contain nicotine or tobacco. These products include cigarettes, chewing tobacco, and vaping devices, such as e-cigarettes. If you need help quitting, ask your health care provider.  Do not use street drugs.  Do not share needles.  Ask your health care provider for help if you need support or information about quitting drugs.  General instructions  Schedule regular health, dental, and eye exams.  Stay current with your vaccines.  Tell your health care provider if:  You often feel depressed.  You have ever been abused or do not feel safe at home.  Summary  Adopting a healthy lifestyle and getting preventive care are important in promoting health and wellness.  Follow your health care provider's instructions about healthy diet, exercising, and getting tested or screened for diseases.  Follow your health care provider's instructions on monitoring your cholesterol and blood pressure.  This information is not intended to replace advice given to you by your health care provider. Make sure you discuss any questions you have with your health care provider.  Document Revised: 02/09/2021 Document Reviewed: 02/09/2021  Elsevier Patient Education  2024 [employer].

## 2024-09-05 NOTE — Progress Notes (Signed)
 [name] 58 y.o.   Chief Complaint  Patient presents with   Annual Exam    HISTORY OF PRESENT ILLNESS: This is a 58 y.o. male A1A here for annual exam and follow-up on chronic medical conditions Concerned about his weight.  Interested in starting Zepbound.  Wife is taking it with good results. Up-to-date with colonoscopy History of hypertension No other complaints or medical concerns today.  HPI   Prior to Admission medications   Medication Sig Start Date End Date Taking? Authorizing Provider  ALPRAZolam  (XANAX ) 0.5 MG tablet Take 1 tablet (0.5 mg total) by mouth 2 (two) times daily as needed for anxiety. 06/29/23   Purcell Emil Schanz, MD  aspirin EC 81 MG tablet Take 81 mg by mouth daily.    [provider]  buPROPion  (WELLBUTRIN  XL) 300 MG 24 hr tablet TAKE 1 TABLET BY MOUTH EVERY DAY 07/04/24   Suriyah Vergara Jose, MD  lisinopril  (ZESTRIL ) 20 MG tablet TAKE 1 TABLET BY MOUTH EVERY DAY 12/21/23   Deakon Frix Jose, MD  Lysine 1000 MG TABS Take 1,000 mg by mouth daily.    [provider]  Multiple Vitamin (MULTIVITAMIN) tablet Take 1 tablet by mouth daily.    [provider]  niacin  500 MG tablet Take 1 tablet (500 mg total) by mouth daily with breakfast. Patient not taking: Reported on 01/18/2023 05/11/18   Shelene Krage Jose, MD  Omega-3 Fatty Acids (FISH OIL ULTRA) 1400 MG CAPS Take 1,400 mg by mouth daily.    [provider]  omeprazole  (PRILOSEC) 20 MG capsule TAKE 1 CAPSULE (20 MG TOTAL) BY MOUTH 2 (TWO) TIMES DAILY BEFORE A MEAL. 09/20/22   Purcell Emil Schanz, MD  sildenafil  (VIAGRA ) 100 MG tablet TAKE 1/2 TO 1 TABLET BY MOUTH EVERY DAY AS NEEDED FOR ERECTILE DYSFUNCTION 07/09/24   Purcell Emil Schanz, MD  simvastatin  (ZOCOR ) 20 MG tablet TAKE 1 TABLET BY MOUTH EVERY DAY 07/03/24   Purcell Emil Schanz, MD  zolpidem  (AMBIEN ) 5 MG tablet Take 1 tablet (5 mg total) by mouth at bedtime as needed. Patient not taking:  Reported on 01/18/2023 08/24/21   Purcell Emil Schanz, MD    Allergies  Allergen Reactions   Thimerosal (Thiomersal) Swelling   Ampicillin Rash    Childhood.   Iodine Rash    Child hood.    Patient Active Problem List   Diagnosis Date Noted   Cervical radiculopathy 10/06/2021   Neuropathic pain 10/06/2021   CPAP (continuous positive airway pressure) dependence 09/14/2021   S/P UPPP (uvulopalatopharyngoplasty) 09/14/2021   History of sleep apnea 05/11/2018   Generalized anxiety disorder 05/11/2018   Sleep disturbance 05/11/2018   History of Barrett's esophagus 05/11/2018   Abnormal EKG 11/24/2016   Smoker 11/24/2016   Family history of coronary artery disease 11/24/2016   Dyslipidemia 08/09/2015   Gastroesophageal reflux disease without esophagitis 08/09/2015   Essential hypertension 08/09/2015   HTN (hypertension) 08/22/2012   Anxiety 08/22/2012   Barrett's esophagus without dysplasia 08/22/2012   OSA on CPAP     Past Medical History:  Diagnosis Date   Abdominal pain    Allergy    seasonal   Anxiety    Barrett's esophagus    Constipation    COVID-19    Diverticulitis    ED (erectile dysfunction)    Family history of adverse reaction to anesthesia    mother has hard time coming out of anethesia   Fatigue    GERD (gastroesophageal reflux disease) 12/2007  HLD (hyperlipidemia)    Hypertension    Insomnia    Myalgia    OSA on CPAP    Psoriasis    Scoliosis    Sleep apnea    Bipap   Vertigo     Past Surgical History:  Procedure Laterality Date   NASAL SINUS SURGERY     REFRACTIVE SURGERY     bilateral   UMBILICAL HERNIA REPAIR N/A 09/22/2021   Procedure: LAPAROSCOPIC REPAIR OF UMBILICAL HERNIA WITH INSERTION OF MESH;  Surgeon: Gladis Cough, MD;  Location: [facility];  Service: General;  Laterality: N/A;   UPPER GASTROINTESTINAL ENDOSCOPY     UVULECTOMY      Social History   Socioeconomic History   Marital status: Married    Spouse name: Lolita Rower   Number of children: 2   Years of education: 16   Highest education level: Not on file  Occupational History   Occupation: Probation Officer: [employer]  Tobacco Use   Smoking status: Former    Current packs/day: 0.00    Types: Cigarettes    Quit date: 01/19/2007    Years since quitting: 17.6   Smokeless tobacco: Never  Vaping Use   Vaping status: Never Used  Substance and Sexual Activity   Alcohol use: Not Currently    Alcohol/week: 4.0 standard drinks of alcohol    Types: 2 Glasses of wine, 2 Cans of beer per week    Comment: occasional   Drug use: No   Sexual activity: Yes    Partners: Female    Birth control/protection: None  Other Topics Concern   Not on file  Social History Narrative   Lives with wife (travelling engineer, civil (consulting), gone about half the time) from whom he is separated, but on good terms.  Shares custody of his daughters with his first wife.  His current wife has two sons, one grown, the other lives with his father.   Right handed    Caffeine: 2 cups of coffee a day   Social Drivers of Corporate Investment Banker Strain: Not on file  Food Insecurity: Not on file  Transportation Needs: Not on file  Physical Activity: Not on file  Stress: Not on file  Social Connections: Not on file  Intimate Partner Violence: Not on file    Family History  Problem Relation Age of Onset   Glaucoma Mother    Cataracts Mother        complicated surgery   Colon polyps Father    Prostate cancer Father        metastatic   Hypertension Father    Alcohol abuse Father    Heart disease Father    Diabetes Father    Cirrhosis Father    Gout Father    Arthritis Father    Anxiety disorder Sister    Hypertension Sister    Sleep apnea Sister    Multiple sclerosis Maternal Aunt    Brain cancer Maternal Uncle    Alcohol abuse Maternal Uncle    Glaucoma Paternal Uncle    Heart disease Paternal Uncle    Alcohol abuse Paternal Uncle    Colon cancer  Paternal Uncle    Heart disease Paternal Uncle    Esophageal cancer Neg Hx    Rectal cancer Neg Hx    Stomach cancer Neg Hx      Review of Systems  Constitutional: Negative.  Negative for chills and fever.  HENT: Negative.  Negative for  congestion and sore throat.   Respiratory: Negative.  Negative for cough and shortness of breath.   Cardiovascular: Negative.  Negative for chest pain and palpitations.  Gastrointestinal:  Negative for abdominal pain, diarrhea, nausea and vomiting.  Genitourinary: Negative.  Negative for dysuria and hematuria.  Skin: Negative.  Negative for rash.  Neurological: Negative.  Negative for dizziness and headaches.  All other systems reviewed and are negative.   Vitals:   09/05/24 1354 09/05/24 1356  BP: (!) 140/100 (!) 140/100  Pulse: 79   Temp: 98.3 F (36.8 C)   SpO2: 98%     Physical Exam Vitals reviewed.  Constitutional:      Appearance: Normal appearance.  HENT:     Head: Normocephalic.     Right Ear: Tympanic membrane, ear canal and external ear normal.     Left Ear: Tympanic membrane, ear canal and external ear normal.     Mouth/Throat:     Mouth: Mucous membranes are moist.     Pharynx: Oropharynx is clear.  Eyes:     Extraocular Movements: Extraocular movements intact.     Conjunctiva/sclera: Conjunctivae normal.     Pupils: Pupils are equal, round, and reactive to light.  Cardiovascular:     Rate and Rhythm: Normal rate and regular rhythm.     Pulses: Normal pulses.     Heart sounds: Normal heart sounds.  Pulmonary:     Effort: Pulmonary effort is normal.     Breath sounds: Normal breath sounds.  Abdominal:     Palpations: Abdomen is soft.     Tenderness: There is no abdominal tenderness.  Musculoskeletal:     Cervical back: No tenderness.  Lymphadenopathy:     Cervical: No cervical adenopathy.  Skin:    General: Skin is warm and dry.     Capillary Refill: Capillary refill takes less than 2 seconds.  Neurological:      General: No focal deficit present.     Mental Status: He is alert and oriented to person, place, and time.  Psychiatric:        Mood and Affect: Mood normal.        Behavior: Behavior normal.      ASSESSMENT & PLAN: Problem List Items Addressed This Visit       Cardiovascular and Mediastinum   Essential hypertension   BP Readings from Last 3 Encounters:  09/05/24 (!) 140/100  06/29/23 (!) 130/96  02/11/23 124/87  Elevated blood pressure reading in the office but normal at home Continues lisinopril  20 mg daily Advised to monitor blood pressure readings at home daily for the next couple of weeks and contact the office if numbers persistently abnormal. May need to increase dose of lisinopril        Relevant Orders   CBC with Differential/Platelet   Comprehensive metabolic panel with GFR     Respiratory   OSA on CPAP   Well-controlled on CPAP 6-hour nightly of quality sleep.      Relevant Orders   CBC with Differential/Platelet     Digestive   Barrett's esophagus without dysplasia   Stable.  No concerns. Continues omeprazole  20 mg daily        Other   Dyslipidemia   Chronic stable condition Diet and nutrition discussed Continue simvastatin  20 mg daily Lipid file done today The 10-year ASCVD risk score (Arnett DK, et al., 2019) is: 9%   Values used to calculate the score:     Age: 67 years  Clincally relevant sex: Male     Is Non-Hispanic African American: No     Diabetic: No     Tobacco smoker: No     Systolic Blood Pressure: 140 mmHg     Is BP treated: Yes     HDL Cholesterol: 44 mg/dL     Total Cholesterol: 164 mg/dL       Relevant Orders   Comprehensive metabolic panel with GFR   Hemoglobin A1c   Lipid panel   Generalized anxiety disorder   Stable and well-controlled Continues Wellbutrin  XL 300 mg daily and alprazolam  as needed      Class 1 obesity due to excess calories with serious comorbidity and body mass index (BMI) of 33.0 to 33.9 in  adult   Diet and nutrition discussed Advised to decrease amount of daily carbohydrate intake and daily calories and increase amount of plant-based protein in his diet. Benefits of exercise discussed Recommend treatment with GLP-1 agonist Prescription for Zepbound sent to pharmacy of record today      Relevant Orders   CBC with Differential/Platelet   Comprehensive metabolic panel with GFR   Hemoglobin A1c   Lipid panel   Other Visit Diagnoses       Encounter for general adult medical examination with abnormal findings    -  Primary   Relevant Orders   CBC with Differential/Platelet   PSA   Comprehensive metabolic panel with GFR   Hemoglobin A1c   Lipid panel     Screening for deficiency anemia       Relevant Orders   CBC with Differential/Platelet     Screening for endocrine, metabolic and immunity disorder       Relevant Orders   Comprehensive metabolic panel with GFR   Hemoglobin A1c     Screening for prostate cancer       Relevant Orders   PSA      Modifiable risk factors discussed with patient. Anticipatory guidance according to age provided. The following topics were also discussed: Social Determinants of Health Smoking.  Non-smoker Diet and nutrition Benefits of exercise Cancer screening and review of colonoscopy report from May 2024 Vaccinations review and recommendations Cardiovascular risk assessment and need for blood work The 10-year ASCVD risk score (Arnett DK, et al., 2019) is: 7.9%   Values used to calculate the score:     Age: 6 years     Clincally relevant sex: Male     Is Non-Hispanic African American: No     Diabetic: No     Tobacco smoker: No     Systolic Blood Pressure: 130 mmHg     Is BP treated: Yes     HDL Cholesterol: 44 mg/dL     Total Cholesterol: 164 mg/dL Diagnosis of hypertension and cardiovascular risks associated with this condition Review of all medications Mental health including depression and anxiety Fall and accident  prevention  Patient Instructions  Health Maintenance, Male Adopting a healthy lifestyle and getting preventive care are important in promoting health and wellness. Ask your health care provider about: The right schedule for you to have regular tests and exams. Things you can do on your own to prevent diseases and keep yourself healthy. What should I know about diet, weight, and exercise? Eat a healthy diet  Eat a diet that includes plenty of vegetables, fruits, low-fat dairy products, and lean protein. Do not eat a lot of foods that are high in solid fats, added sugars, or sodium. Maintain a healthy weight Body  mass index (BMI) is a measurement that can be used to identify possible weight problems. It estimates body fat based on height and weight. Your health care provider can help determine your BMI and help you achieve or maintain a healthy weight. Get regular exercise Get regular exercise. This is one of the most important things you can do for your health. Most adults should: Exercise for at least 150 minutes each week. The exercise should increase your heart rate and make you sweat (moderate-intensity exercise). Do strengthening exercises at least twice a week. This is in addition to the moderate-intensity exercise. Spend less time sitting. Even light physical activity can be beneficial. Watch cholesterol and blood lipids Have your blood tested for lipids and cholesterol at 58 years of age, then have this test every 5 years. You may need to have your cholesterol levels checked more often if: Your lipid or cholesterol levels are high. You are older than 58 years of age. You are at high risk for heart disease. What should I know about cancer screening? Many types of cancers can be detected early and may often be prevented. Depending on your health history and family history, you may need to have cancer screening at various ages. This may include screening for: Colorectal  cancer. Prostate cancer. Skin cancer. Lung cancer. What should I know about heart disease, diabetes, and high blood pressure? Blood pressure and heart disease High blood pressure causes heart disease and increases the risk of stroke. This is more likely to develop in people who have high blood pressure readings or are overweight. Talk with your health care provider about your target blood pressure readings. Have your blood pressure checked: Every 3-5 years if you are 49-68 years of age. Every year if you are 26 years old or older. If you are between the ages of 74 and 108 and are a current or former smoker, ask your health care provider if you should have a one-time screening for abdominal aortic aneurysm (AAA). Diabetes Have regular diabetes screenings. This checks your fasting blood sugar level. Have the screening done: Once every three years after age 17 if you are at a normal weight and have a low risk for diabetes. More often and at a younger age if you are overweight or have a high risk for diabetes. What should I know about preventing infection? Hepatitis B If you have a higher risk for hepatitis B, you should be screened for this virus. Talk with your health care provider to find out if you are at risk for hepatitis B infection. Hepatitis C Blood testing is recommended for: Everyone born from [birth year] through 1965. Anyone with known risk factors for hepatitis C. Sexually transmitted infections (STIs) You should be screened each year for STIs, including gonorrhea and chlamydia, if: You are sexually active and are younger than 58 years of age. You are older than 58 years of age and your health care provider tells you that you are at risk for this type of infection. Your sexual activity has changed since you were last screened, and you are at increased risk for chlamydia or gonorrhea. Ask your health care provider if you are at risk. Ask your health care provider about whether you are at  high risk for HIV. Your health care provider may recommend a prescription medicine to help prevent HIV infection. If you choose to take medicine to prevent HIV, you should first get tested for HIV. You should then be tested every 3 months for as  long as you are taking the medicine. Follow these instructions at home: Alcohol use Do not drink alcohol if your health care provider tells you not to drink. If you drink alcohol: Limit how much you have to 0-2 drinks a day. Know how much alcohol is in your drink. In the [place] one drink equals one 12 oz bottle of beer (355 mL), one 5 oz glass of wine (148 mL), or one 1 oz glass of hard liquor (44 mL). Lifestyle Do not use any products that contain nicotine or tobacco. These products include cigarettes, chewing tobacco, and vaping devices, such as e-cigarettes. If you need help quitting, ask your health care provider. Do not use street drugs. Do not share needles. Ask your health care provider for help if you need support or information about quitting drugs. General instructions Schedule regular health, dental, and eye exams. Stay current with your vaccines. Tell your health care provider if: You often feel depressed. You have ever been abused or do not feel safe at home. Summary Adopting a healthy lifestyle and getting preventive care are important in promoting health and wellness. Follow your health care provider's instructions about healthy diet, exercising, and getting tested or screened for diseases. Follow your health care provider's instructions on monitoring your cholesterol and blood pressure. This information is not intended to replace advice given to you by your health care provider. Make sure you discuss any questions you have with your health care provider. Document Revised: 02/09/2021 Document Reviewed: 02/09/2021 Elsevier Patient Education  2024 Elsevier [employer]      Emil Schaumann, MD [facility]

## 2024-09-05 NOTE — Assessment & Plan Note (Signed)
Well-controlled on CPAP 6-hour nightly of quality sleep.

## 2024-09-05 NOTE — Assessment & Plan Note (Signed)
Stable and well-controlled Continues Wellbutrin XL 300 mg daily and alprazolam as needed

## 2024-09-05 NOTE — Assessment & Plan Note (Signed)
 Diet and nutrition discussed Advised to decrease amount of daily carbohydrate intake and daily calories and increase amount of plant-based protein in his diet. Benefits of exercise discussed Recommend treatment with GLP-1 agonist Prescription for Zepbound sent to pharmacy of record today

## 2024-09-06 ENCOUNTER — Ambulatory Visit: Payer: Self-pay | Admitting: Emergency Medicine

## 2024-09-17 ENCOUNTER — Other Ambulatory Visit: Payer: Self-pay

## 2024-09-17 DIAGNOSIS — E782 Mixed hyperlipidemia: Secondary | ICD-10-CM

## 2024-09-17 DIAGNOSIS — F411 Generalized anxiety disorder: Secondary | ICD-10-CM

## 2024-09-17 DIAGNOSIS — N529 Male erectile dysfunction, unspecified: Secondary | ICD-10-CM

## 2024-09-17 DIAGNOSIS — K2270 Barrett's esophagus without dysplasia: Secondary | ICD-10-CM

## 2024-09-17 DIAGNOSIS — I10 Essential (primary) hypertension: Secondary | ICD-10-CM

## 2024-09-17 MED ORDER — BUPROPION HCL ER (XL) 300 MG PO TB24: 300.0000 mg | ORAL_TABLET | Freq: Every day | ORAL | 0 refills | Status: AC

## 2024-09-17 MED ORDER — LISINOPRIL 20 MG PO TABS: 20.0000 mg | ORAL_TABLET | Freq: Every day | ORAL | 0 refills | Status: AC

## 2024-09-17 MED ORDER — SIMVASTATIN 20 MG PO TABS: 20.0000 mg | ORAL_TABLET | Freq: Every day | ORAL | 0 refills | Status: AC

## 2024-09-17 MED ORDER — SILDENAFIL CITRATE 100 MG PO TABS: 100.0000 mg | ORAL_TABLET | ORAL | 11 refills | Status: AC | PRN

## 2024-09-17 MED ORDER — OMEPRAZOLE 20 MG PO CPDR: 20.0000 mg | DELAYED_RELEASE_CAPSULE | Freq: Two times a day (BID) | ORAL | 0 refills | Status: AC

## 2024-09-25 ENCOUNTER — Other Ambulatory Visit: Payer: Self-pay | Admitting: Family Medicine

## 2024-09-25 DIAGNOSIS — F411 Generalized anxiety disorder: Secondary | ICD-10-CM

## 2024-09-25 MED ORDER — ALPRAZOLAM 0.5 MG PO TABS: 0.5000 mg | ORAL_TABLET | Freq: Two times a day (BID) | ORAL | 0 refills | Status: AC | PRN

## 2024-09-25 NOTE — Telephone Encounter (Signed)
 Please send in refills for patient as MD is out of office

## 2024-09-28 ENCOUNTER — Encounter: Payer: Self-pay | Admitting: *Deleted

## 2024-09-28 NOTE — Progress Notes (Signed)
 [name]                                          MRN: [id number]   09/28/2024   The [facility] Quality Team Specialist reviewed this patient medical record for the purposes of chart review for care gap closure. The following were reviewed: abstraction for care gap closure-controlling blood pressure.    [facility] Quality Team

## 2025-01-15 ENCOUNTER — Ambulatory Visit: Admitting: Adult Health
